# Patient Record
Sex: Female | Born: 1970 | Race: White | Hispanic: No | Smoking: Current some day smoker
Health system: Southern US, Community
[De-identification: ages and names within clinical notes are randomized; demographics above are authoritative.]

## PROBLEM LIST (undated history)

## (undated) DIAGNOSIS — J189 Pneumonia, unspecified organism: Secondary | ICD-10-CM

## (undated) DIAGNOSIS — M797 Fibromyalgia: Secondary | ICD-10-CM

## (undated) DIAGNOSIS — G4730 Sleep apnea, unspecified: Secondary | ICD-10-CM

## (undated) DIAGNOSIS — M1990 Unspecified osteoarthritis, unspecified site: Secondary | ICD-10-CM

## (undated) DIAGNOSIS — F319 Bipolar disorder, unspecified: Secondary | ICD-10-CM

## (undated) DIAGNOSIS — E7800 Pure hypercholesterolemia, unspecified: Secondary | ICD-10-CM

## (undated) DIAGNOSIS — K635 Polyp of colon: Secondary | ICD-10-CM

## (undated) DIAGNOSIS — R911 Solitary pulmonary nodule: Secondary | ICD-10-CM

## (undated) DIAGNOSIS — K589 Irritable bowel syndrome without diarrhea: Secondary | ICD-10-CM

## (undated) DIAGNOSIS — N200 Calculus of kidney: Secondary | ICD-10-CM

## (undated) DIAGNOSIS — E119 Type 2 diabetes mellitus without complications: Secondary | ICD-10-CM

## (undated) DIAGNOSIS — F4310 Post-traumatic stress disorder, unspecified: Secondary | ICD-10-CM

## (undated) DIAGNOSIS — I10 Essential (primary) hypertension: Secondary | ICD-10-CM

## (undated) DIAGNOSIS — K219 Gastro-esophageal reflux disease without esophagitis: Secondary | ICD-10-CM

## (undated) DIAGNOSIS — G43909 Migraine, unspecified, not intractable, without status migrainosus: Secondary | ICD-10-CM

## (undated) DIAGNOSIS — C439 Malignant melanoma of skin, unspecified: Secondary | ICD-10-CM

## (undated) HISTORY — PX: KNEE ARTHROSCOPY: SUR90

## (undated) HISTORY — PX: THROAT SURGERY: SHX803

## (undated) HISTORY — PX: OTHER SURGICAL HISTORY: SHX169

## (undated) HISTORY — PX: DILATION AND CURETTAGE OF UTERUS: SHX78

---

## 2011-09-30 DIAGNOSIS — G4730 Sleep apnea, unspecified: Secondary | ICD-10-CM | POA: Insufficient documentation

## 2011-12-01 DIAGNOSIS — Z872 Personal history of diseases of the skin and subcutaneous tissue: Secondary | ICD-10-CM | POA: Insufficient documentation

## 2011-12-01 DIAGNOSIS — Z85820 Personal history of malignant melanoma of skin: Secondary | ICD-10-CM | POA: Insufficient documentation

## 2011-12-01 DIAGNOSIS — L709 Acne, unspecified: Secondary | ICD-10-CM | POA: Insufficient documentation

## 2012-09-02 DIAGNOSIS — I10 Essential (primary) hypertension: Secondary | ICD-10-CM | POA: Insufficient documentation

## 2012-09-02 DIAGNOSIS — E785 Hyperlipidemia, unspecified: Secondary | ICD-10-CM | POA: Insufficient documentation

## 2012-09-02 DIAGNOSIS — E119 Type 2 diabetes mellitus without complications: Secondary | ICD-10-CM | POA: Insufficient documentation

## 2014-12-20 DIAGNOSIS — G4733 Obstructive sleep apnea (adult) (pediatric): Secondary | ICD-10-CM | POA: Insufficient documentation

## 2014-12-20 DIAGNOSIS — R062 Wheezing: Secondary | ICD-10-CM | POA: Insufficient documentation

## 2014-12-20 DIAGNOSIS — Z720 Tobacco use: Secondary | ICD-10-CM | POA: Insufficient documentation

## 2014-12-20 DIAGNOSIS — R0602 Shortness of breath: Secondary | ICD-10-CM | POA: Insufficient documentation

## 2014-12-20 DIAGNOSIS — Z9989 Dependence on other enabling machines and devices: Secondary | ICD-10-CM | POA: Insufficient documentation

## 2015-06-14 DIAGNOSIS — M25552 Pain in left hip: Secondary | ICD-10-CM | POA: Insufficient documentation

## 2015-12-03 HISTORY — PX: TUBAL LIGATION: SHX77

## 2016-03-18 DIAGNOSIS — Z720 Tobacco use: Secondary | ICD-10-CM | POA: Insufficient documentation

## 2016-03-18 DIAGNOSIS — J189 Pneumonia, unspecified organism: Secondary | ICD-10-CM | POA: Insufficient documentation

## 2016-03-18 DIAGNOSIS — D72825 Bandemia: Secondary | ICD-10-CM | POA: Insufficient documentation

## 2016-03-18 DIAGNOSIS — G9340 Encephalopathy, unspecified: Secondary | ICD-10-CM | POA: Insufficient documentation

## 2016-03-18 DIAGNOSIS — E1165 Type 2 diabetes mellitus with hyperglycemia: Secondary | ICD-10-CM | POA: Insufficient documentation

## 2016-03-18 DIAGNOSIS — E871 Hypo-osmolality and hyponatremia: Secondary | ICD-10-CM | POA: Insufficient documentation

## 2016-03-18 DIAGNOSIS — F1910 Other psychoactive substance abuse, uncomplicated: Secondary | ICD-10-CM | POA: Insufficient documentation

## 2016-03-21 DIAGNOSIS — F1110 Opioid abuse, uncomplicated: Secondary | ICD-10-CM | POA: Insufficient documentation

## 2016-03-21 DIAGNOSIS — F339 Major depressive disorder, recurrent, unspecified: Secondary | ICD-10-CM | POA: Insufficient documentation

## 2016-04-07 DIAGNOSIS — T50902A Poisoning by unspecified drugs, medicaments and biological substances, intentional self-harm, initial encounter: Secondary | ICD-10-CM | POA: Insufficient documentation

## 2016-07-17 ENCOUNTER — Encounter (HOSPITAL_COMMUNITY): Payer: Self-pay | Admitting: Emergency Medicine

## 2016-07-17 ENCOUNTER — Emergency Department (HOSPITAL_COMMUNITY)
Admission: EM | Admit: 2016-07-17 | Discharge: 2016-07-17 | Disposition: A | Discharge status: Home / Self Care | Payer: Medicare Other | Attending: Emergency Medicine | Admitting: Emergency Medicine

## 2016-07-17 ENCOUNTER — Emergency Department (HOSPITAL_COMMUNITY): Payer: Medicare Other

## 2016-07-17 DIAGNOSIS — I10 Essential (primary) hypertension: Secondary | ICD-10-CM | POA: Diagnosis not present

## 2016-07-17 DIAGNOSIS — E119 Type 2 diabetes mellitus without complications: Secondary | ICD-10-CM | POA: Diagnosis not present

## 2016-07-17 DIAGNOSIS — F17200 Nicotine dependence, unspecified, uncomplicated: Secondary | ICD-10-CM | POA: Insufficient documentation

## 2016-07-17 DIAGNOSIS — Z7984 Long term (current) use of oral hypoglycemic drugs: Secondary | ICD-10-CM | POA: Insufficient documentation

## 2016-07-17 DIAGNOSIS — K5900 Constipation, unspecified: Secondary | ICD-10-CM | POA: Insufficient documentation

## 2016-07-17 DIAGNOSIS — Z79899 Other long term (current) drug therapy: Secondary | ICD-10-CM | POA: Diagnosis not present

## 2016-07-17 DIAGNOSIS — Z7982 Long term (current) use of aspirin: Secondary | ICD-10-CM | POA: Insufficient documentation

## 2016-07-17 DIAGNOSIS — K5641 Fecal impaction: Secondary | ICD-10-CM

## 2016-07-17 DIAGNOSIS — R1032 Left lower quadrant pain: Secondary | ICD-10-CM | POA: Diagnosis present

## 2016-07-17 HISTORY — DX: Migraine, unspecified, not intractable, without status migrainosus: G43.909

## 2016-07-17 HISTORY — DX: Irritable bowel syndrome, unspecified: K58.9

## 2016-07-17 HISTORY — DX: Type 2 diabetes mellitus without complications: E11.9

## 2016-07-17 HISTORY — DX: Solitary pulmonary nodule: R91.1

## 2016-07-17 HISTORY — DX: Sleep apnea, unspecified: G47.30

## 2016-07-17 HISTORY — DX: Bipolar disorder, unspecified: F31.9

## 2016-07-17 HISTORY — DX: Post-traumatic stress disorder, unspecified: F43.10

## 2016-07-17 HISTORY — DX: Polyp of colon: K63.5

## 2016-07-17 HISTORY — DX: Essential (primary) hypertension: I10

## 2016-07-17 HISTORY — DX: Pure hypercholesterolemia, unspecified: E78.00

## 2016-07-17 HISTORY — DX: Calculus of kidney: N20.0

## 2016-07-17 HISTORY — DX: Gastro-esophageal reflux disease without esophagitis: K21.9

## 2016-07-17 HISTORY — DX: Malignant melanoma of skin, unspecified: C43.9

## 2016-07-17 HISTORY — DX: Unspecified osteoarthritis, unspecified site: M19.90

## 2016-07-17 LAB — URINALYSIS, ROUTINE W REFLEX MICROSCOPIC
Bilirubin Urine: NEGATIVE
Glucose, UA: NEGATIVE mg/dL
Hgb urine dipstick: NEGATIVE
Ketones, ur: NEGATIVE mg/dL
Leukocytes, UA: NEGATIVE
Nitrite: NEGATIVE
Protein, ur: NEGATIVE mg/dL
Specific Gravity, Urine: 1.005 (ref 1.005–1.030)
pH: 7.5 (ref 5.0–8.0)

## 2016-07-17 LAB — CBC
HCT: 39.1 % (ref 36.0–46.0)
Hemoglobin: 12.1 g/dL (ref 12.0–15.0)
MCH: 24.6 pg — ABNORMAL LOW (ref 26.0–34.0)
MCHC: 30.9 g/dL (ref 30.0–36.0)
MCV: 79.5 fL (ref 78.0–100.0)
Platelets: 332 10*3/uL (ref 150–400)
RBC: 4.92 MIL/uL (ref 3.87–5.11)
RDW: 16.8 % — ABNORMAL HIGH (ref 11.5–15.5)
WBC: 11.8 10*3/uL — ABNORMAL HIGH (ref 4.0–10.5)

## 2016-07-17 LAB — COMPREHENSIVE METABOLIC PANEL
ALT: 18 U/L (ref 14–54)
AST: 21 U/L (ref 15–41)
Albumin: 4.1 g/dL (ref 3.5–5.0)
Alkaline Phosphatase: 58 U/L (ref 38–126)
Anion gap: 7 (ref 5–15)
BUN: 9 mg/dL (ref 6–20)
CO2: 26 mmol/L (ref 22–32)
Calcium: 9 mg/dL (ref 8.9–10.3)
Chloride: 104 mmol/L (ref 101–111)
Creatinine, Ser: 0.57 mg/dL (ref 0.44–1.00)
GFR calc Af Amer: >60 mL/min (ref >60)
GFR calc non Af Amer: >60 mL/min (ref >60)
Glucose, Bld: 111 mg/dL — ABNORMAL HIGH (ref 65–99)
Potassium: 4 mmol/L (ref 3.5–5.1)
Sodium: 137 mmol/L (ref 135–145)
Total Bilirubin: 0.3 mg/dL (ref 0.3–1.2)
Total Protein: 7.1 g/dL (ref 6.5–8.1)

## 2016-07-17 LAB — LIPASE, BLOOD: Lipase: 78 U/L — ABNORMAL HIGH (ref 11–51)

## 2016-07-17 IMAGING — CR DG ABDOMEN ACUTE W/ 1V CHEST
4 series · 4 of 4 positions shown · non-contrast
Comparison: [DATE]

CLINICAL DATA: Left lower quadrant pain that radiates across the
abdomen and back for several days. History of hypertension.

EXAM:
DG ABDOMEN ACUTE W/ 1V CHEST

[w chest pa]
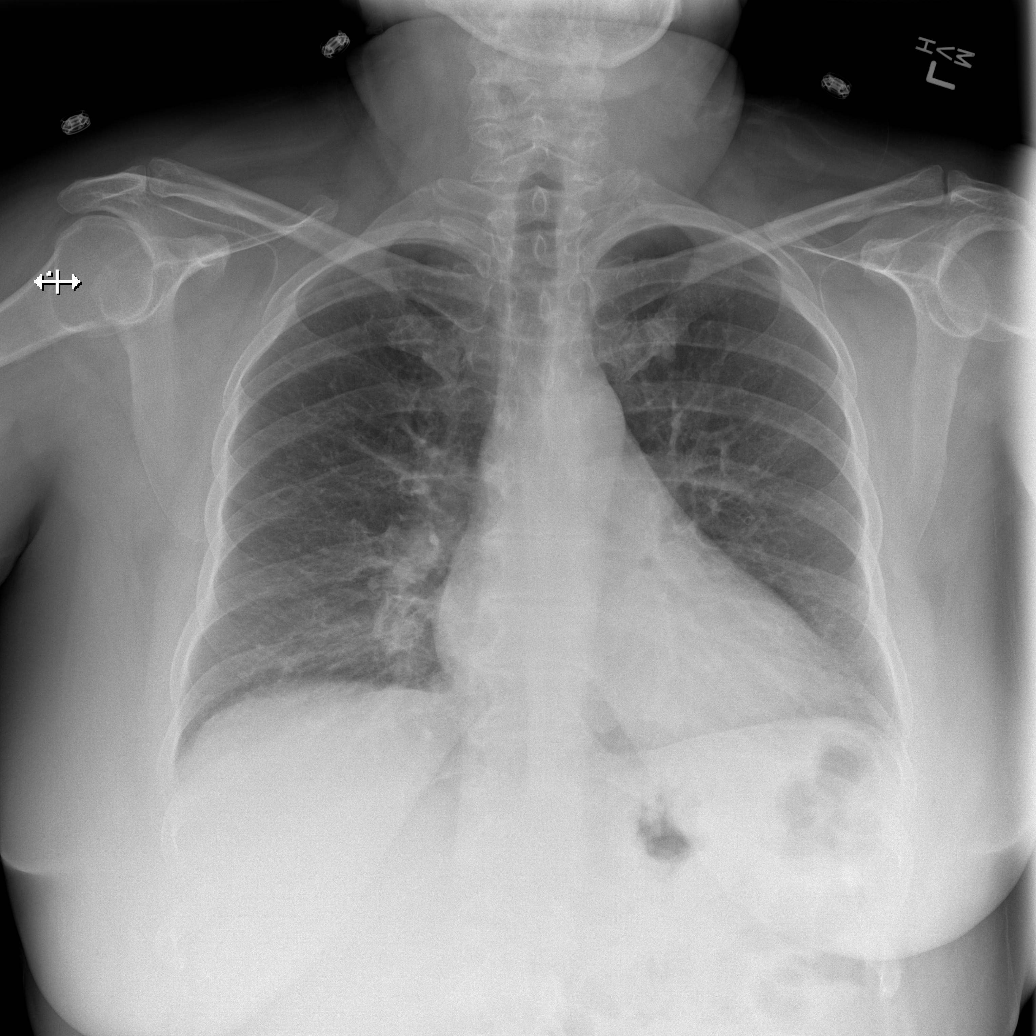

[w abdomen upright]
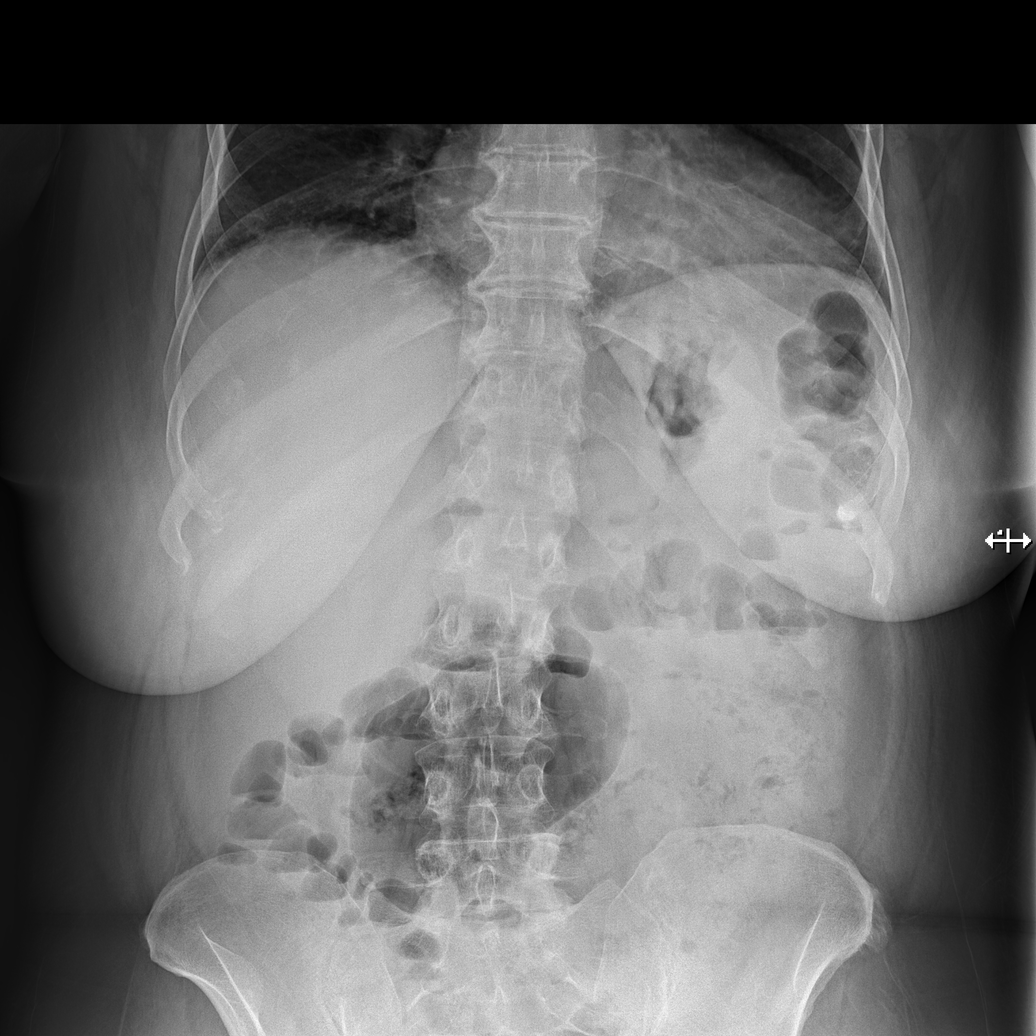

[t abdomen supine (1 of 2)]
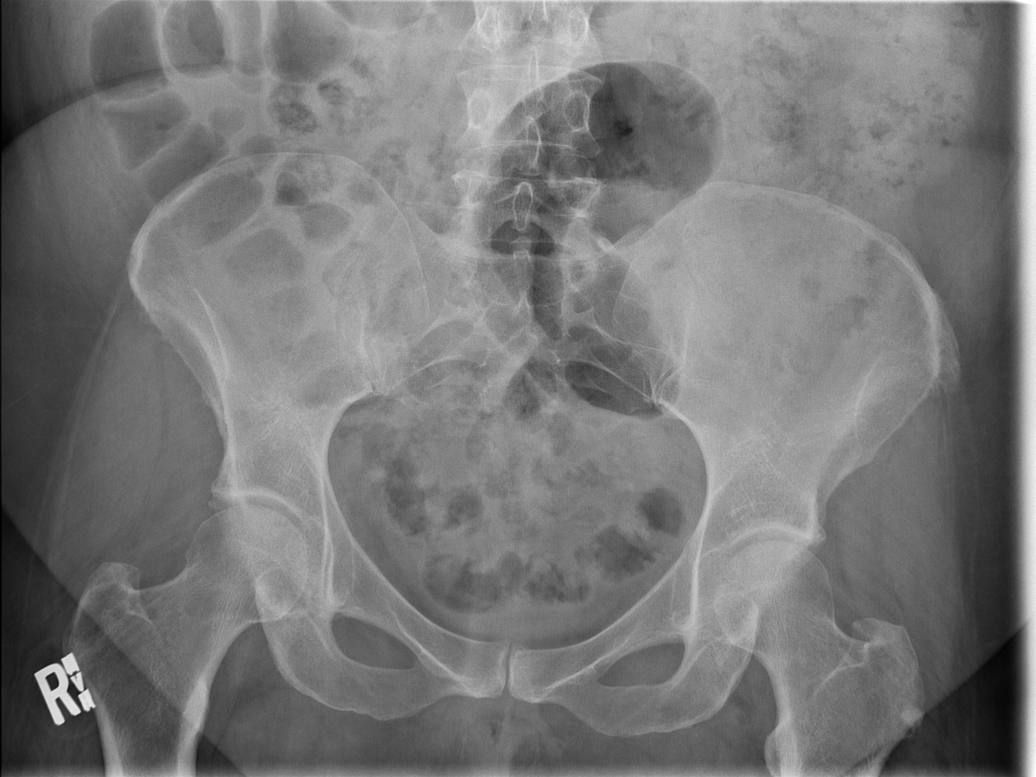

[t abdomen supine (2 of 2)]
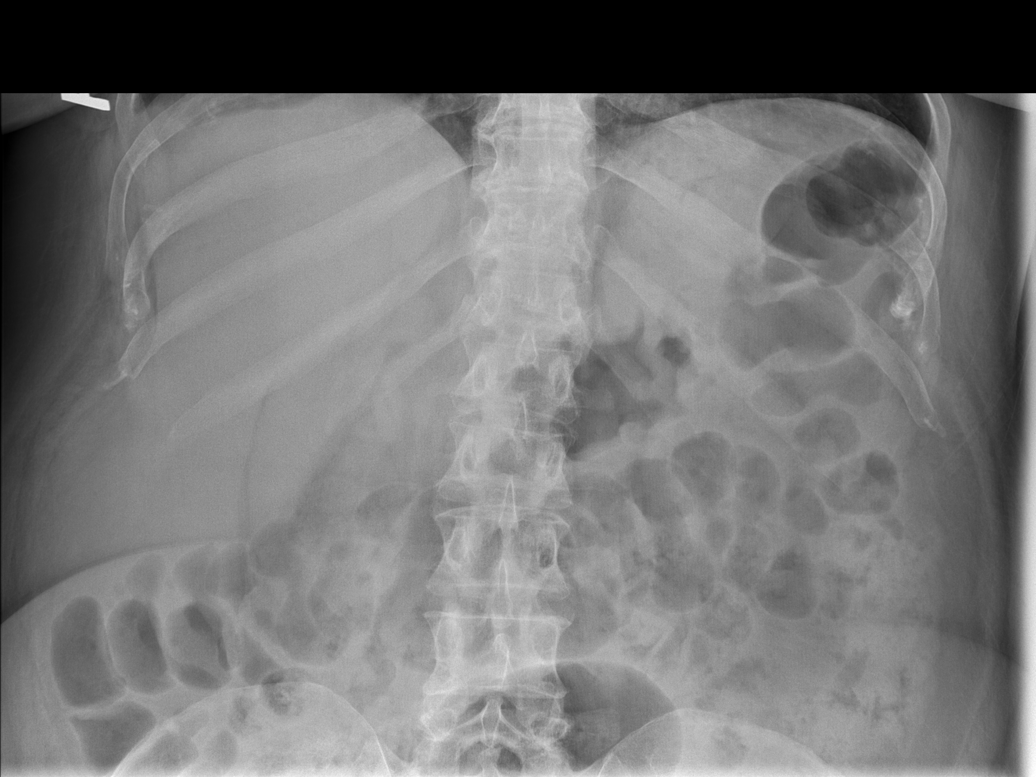

[4 of 4 positions shown; findings below may reference images not displayed]

FINDINGS: PA view chest demonstrates streaky right greater than left bibasilar
opacities, likely atelectasis. No focal consolidation.
Cardiomediastinal silhouette is borderline enlarged. No
pneumothorax.

Supine and upright views of the abdomen demonstrate no free air
beneath the diaphragm. Overall nonspecific gas pattern with mild air
filled colon and prominent loop of sigmoid colon in the pelvis.
Moderate stool is present within the colon. No pathologic
calcifications.
IMPRESSION: 1. Streaky bibasilar right greater than left probable atelectasis.
Borderline enlargement of the heart size.
2. Nonspecific gas pattern with moderate stool in the colon.

## 2016-07-17 MED ORDER — MILK AND MOLASSES ENEMA
1.0000 | Freq: Once | RECTAL | Status: AC
  Administered 2016-07-17: 250 mL via RECTAL
  Filled 2016-07-17: qty 250

## 2016-07-17 MED ORDER — MAGNESIUM CITRATE PO SOLN
1.0000 | Freq: Once | ORAL | Status: AC
  Administered 2016-07-17: 1 via ORAL
  Filled 2016-07-17: qty 296

## 2016-07-17 MED ORDER — LACTULOSE 10 GM/15ML PO SOLN
30.0000 g | Freq: Once | ORAL | Status: AC
  Administered 2016-07-17: 30 g via ORAL
  Filled 2016-07-17: qty 45

## 2016-07-17 NOTE — ED Triage Notes (Signed)
Pt reports LLQ pain that radiates across entire abd and back for the past several days. No n/v/d. Feels constipated, but has had several BMs over past few days. No dark stools.

## 2016-07-17 NOTE — ED Provider Notes (Signed)
[facility] DEPT Provider Note   CSN: [id number] Arrival date & time: 07/17/16  1527     History   Chief Complaint Chief Complaint  Patient presents with  . Abdominal Pain    HPI Stacy Guzman is a 45 y.o. female.  Pt said that she has had LLQ abdominal pain with constipation for the past few days.  The pt said that she tried multiple otc meds and an otc enema without success.  The pt has a lot of abdominal cramping.      Past Medical History:  Diagnosis Date  . Arthritis   . Bipolar 1 disorder ([place])   . Colon polyp   . Diabetes mellitus without complication ([place])   . GERD (gastroesophageal reflux disease)   . Hypercholesteremia   . Hypertension   . IBS (irritable bowel syndrome)   . Kidney stone   . Lung nodule   . Melanoma ([place])   . Migraine   . PTSD (post-traumatic stress disorder)   . Sleep apnea     There are no active problems to display for this patient.   History reviewed. No pertinent surgical history.  OB History    No data available       Home Medications    Prior to Admission medications   Medication Sig Start Date End Date Taking? Authorizing Provider  aspirin EC 81 MG tablet Take 81 mg by mouth daily.   Yes Historical Provider, MD  atenolol (TENORMIN) 50 MG tablet Take 50 mg by mouth daily.   Yes Historical Provider, MD  Cholecalciferol (VITAMIN D3) 5000 units TABS Take 1 tablet by mouth 3 (three) times a week. MWF   Yes Historical Provider, MD  Cinnamon 500 MG capsule Take 500 mg by mouth 2 (two) times daily.   Yes Historical Provider, MD  cyclobenzaprine (FLEXERIL) 10 MG tablet Take 20 mg by mouth 3 (three) times daily as needed for muscle spasms.   Yes Historical Provider, MD  dicyclomine (BENTYL) 20 MG tablet Take 20 mg by mouth 4 (four) times daily.   Yes Historical Provider, MD  DULoxetine (CYMBALTA) 60 MG capsule Take 60 mg by mouth 2 (two) times daily.   Yes Historical Provider, MD  fenofibrate 160 MG tablet Take 160 mg by mouth  daily.   Yes Historical Provider, MD  gabapentin (NEURONTIN) 600 MG tablet Take 1,200 mg by mouth 2 (two) times daily.   Yes Historical Provider, MD  glipiZIDE (GLUCOTROL XL) 5 MG 24 hr tablet Take 5 mg by mouth 2 (two) times daily.   Yes Historical Provider, MD  Magnesium 500 MG TABS Take 1 tablet by mouth 2 (two) times daily.   Yes Historical Provider, MD  metFORMIN (GLUCOPHAGE) 1000 MG tablet Take 1,000 mg by mouth 2 (two) times daily with a meal.   Yes Historical Provider, MD  Omega-3 Fatty Acids (FISH OIL) 1200 MG CAPS Take 1 capsule by mouth daily.   Yes Historical Provider, MD  omeprazole (PRILOSEC) 40 MG capsule Take 40 mg by mouth 2 (two) times daily.   Yes Historical Provider, MD  pioglitazone (ACTOS) 15 MG tablet Take 15 mg by mouth daily.   Yes Historical Provider, MD  pravastatin (PRAVACHOL) 40 MG tablet Take 40 mg by mouth daily.   Yes Historical Provider, MD  SUMAtriptan (IMITREX) 50 MG tablet Take 50 mg by mouth every 2 (two) hours as needed for migraine. May repeat in 2 hours if headache persists or recurs.   Yes Historical Provider, MD  Turmeric 500 MG TABS Take 1 tablet by mouth 2 (two) times daily.   Yes Historical Provider, MD    Family History History reviewed. No pertinent family history.  Social History Social History  Substance Use Topics  . Smoking status: Current Some Day Smoker  . Smokeless tobacco: Never Used  . Alcohol use Yes     Allergies   Ibuprofen; Lamictal [lamotrigine]; Lyrica [pregabalin]; Naproxen; and Nsaids   Review of Systems Review of Systems  Gastrointestinal: Positive for abdominal pain and constipation.  All other systems reviewed and are negative.    Physical Exam Updated Vital Signs BP 128/69   Pulse 62   Temp 97.9 F (36.6 C) (Oral)   Resp 20   SpO2 99%   Physical Exam  Constitutional: She is oriented to person, place, and time. She appears well-developed and well-nourished.  HENT:  Head: Normocephalic and atraumatic.    Right Ear: External ear normal.  Left Ear: External ear normal.  Nose: Nose normal.  Mouth/Throat: Oropharynx is clear and moist.  Eyes: Conjunctivae and EOM are normal. Pupils are equal, round, and reactive to light.  Neck: Normal range of motion. Neck supple.  Cardiovascular: Normal rate, regular rhythm, normal heart sounds and intact distal pulses.   Pulmonary/Chest: Effort normal and breath sounds normal.  Abdominal: Soft. Bowel sounds are decreased.  Musculoskeletal: Normal range of motion.  Neurological: She is alert and oriented to person, place, and time.  Skin: Skin is warm.  Psychiatric: She has a normal mood and affect. Her behavior is normal. Judgment and thought content normal.  Nursing note and vitals reviewed.    ED Treatments / Results  Labs (all labs ordered are listed, but only abnormal results are displayed) Labs Reviewed  LIPASE, BLOOD - Abnormal; Notable for the following:       Result Value   Lipase 78 (*)    All other components within normal limits  COMPREHENSIVE METABOLIC PANEL - Abnormal; Notable for the following:    Glucose, Bld 111 (*)    All other components within normal limits  CBC - Abnormal; Notable for the following:    WBC 11.8 (*)    MCH 24.6 (*)    RDW 16.8 (*)    All other components within normal limits  URINALYSIS, ROUTINE W REFLEX MICROSCOPIC (NOT AT [facility]) - Abnormal; Notable for the following:    APPearance CLOUDY (*)    All other components within normal limits    EKG  EKG Interpretation None       Radiology Dg Abdomen Acute W/chest  Result Date: 07/17/2016 CLINICAL DATA:  Left lower quadrant pain that radiates across the abdomen and back for several days. History of hypertension. EXAM: DG ABDOMEN ACUTE W/ 1V CHEST COMPARISON:  03/18/2016 FINDINGS: PA view chest demonstrates streaky right greater than left bibasilar opacities, likely atelectasis. No focal consolidation. Cardiomediastinal silhouette is borderline enlarged.  No pneumothorax. Supine and upright views of the abdomen demonstrate no free air beneath the diaphragm. Overall nonspecific gas pattern with mild air filled colon and prominent loop of sigmoid colon in the pelvis. Moderate stool is present within the colon. No pathologic calcifications. IMPRESSION: 1. Streaky bibasilar right greater than left probable atelectasis. Borderline enlargement of the heart size. 2. Nonspecific gas pattern with moderate stool in the colon. Electronically Signed   By: Donavan Foil M.D.   On: 07/17/2016 18:59    Procedures Procedures (including critical care time)  Medications Ordered in ED Medications  milk and molasses  enema (not administered)  lactulose (CHRONULAC) 10 GM/15ML solution 30 g (not administered)  magnesium citrate solution 1 Bottle (not administered)     Initial Impression / Assessment and Plan / ED Course  I have reviewed the triage vital signs and the nursing notes.  Pertinent labs & imaging results that were available during my care of the patient were reviewed by me and considered in my medical decision making (see chart for details).  Clinical Course    Pt feels better after enema and disimpaction, but still  She does not have money, so does not want a rx.  The pt will be given a dose of lactulose and mgci prior to d/c.    Final Clinical Impressions(s) / ED Diagnoses   Final diagnoses:  Constipation, unspecified constipation type  Fecal impaction [facility])    New Prescriptions New Prescriptions   No medications on file     Isla Pence, MD 07/17/16 2041

## 2016-08-30 ENCOUNTER — Encounter (HOSPITAL_COMMUNITY): Payer: Self-pay | Admitting: *Deleted

## 2016-08-30 ENCOUNTER — Emergency Department (HOSPITAL_COMMUNITY)
Admission: EM | Admit: 2016-08-30 | Discharge: 2016-08-30 | Disposition: A | Discharge status: Home / Self Care | Payer: Medicare Other | Attending: Emergency Medicine | Admitting: Emergency Medicine

## 2016-08-30 ENCOUNTER — Emergency Department (HOSPITAL_COMMUNITY): Payer: Medicare Other

## 2016-08-30 DIAGNOSIS — I10 Essential (primary) hypertension: Secondary | ICD-10-CM | POA: Insufficient documentation

## 2016-08-30 DIAGNOSIS — Z85820 Personal history of malignant melanoma of skin: Secondary | ICD-10-CM | POA: Diagnosis not present

## 2016-08-30 DIAGNOSIS — E119 Type 2 diabetes mellitus without complications: Secondary | ICD-10-CM | POA: Insufficient documentation

## 2016-08-30 DIAGNOSIS — F17200 Nicotine dependence, unspecified, uncomplicated: Secondary | ICD-10-CM | POA: Insufficient documentation

## 2016-08-30 DIAGNOSIS — Z7982 Long term (current) use of aspirin: Secondary | ICD-10-CM | POA: Insufficient documentation

## 2016-08-30 DIAGNOSIS — Z7984 Long term (current) use of oral hypoglycemic drugs: Secondary | ICD-10-CM | POA: Diagnosis not present

## 2016-08-30 DIAGNOSIS — R0789 Other chest pain: Secondary | ICD-10-CM | POA: Diagnosis not present

## 2016-08-30 LAB — BASIC METABOLIC PANEL
Anion gap: 10 (ref 5–15)
BUN: 9 mg/dL (ref 6–20)
CO2: 23 mmol/L (ref 22–32)
Calcium: 9.3 mg/dL (ref 8.9–10.3)
Chloride: 103 mmol/L (ref 101–111)
Creatinine, Ser: 0.62 mg/dL (ref 0.44–1.00)
GFR calc Af Amer: >60 mL/min (ref >60)
GFR calc non Af Amer: >60 mL/min (ref >60)
Glucose, Bld: 118 mg/dL — ABNORMAL HIGH (ref 65–99)
Potassium: 3.9 mmol/L (ref 3.5–5.1)
Sodium: 136 mmol/L (ref 135–145)

## 2016-08-30 LAB — CBC
HCT: 39.6 % (ref 36.0–46.0)
Hemoglobin: 12.3 g/dL (ref 12.0–15.0)
MCH: 24.3 pg — ABNORMAL LOW (ref 26.0–34.0)
MCHC: 31.1 g/dL (ref 30.0–36.0)
MCV: 78.3 fL (ref 78.0–100.0)
Platelets: 529 10*3/uL — ABNORMAL HIGH (ref 150–400)
RBC: 5.06 MIL/uL (ref 3.87–5.11)
RDW: 18.4 % — ABNORMAL HIGH (ref 11.5–15.5)
WBC: 18.5 10*3/uL — ABNORMAL HIGH (ref 4.0–10.5)

## 2016-08-30 LAB — I-STAT TROPONIN, ED: Troponin i, poc: 0 ng/mL (ref 0.00–0.08)
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
## 2016-08-30 NOTE — Discharge Instructions (Signed)
If you were given medicines take as directed.  If you are on coumadin or contraceptives realize their levels and effectiveness is altered by many different medicines.  If you have any reaction (rash, tongues swelling, other) to the medicines stop taking and see a physician.    If your blood pressure was elevated in the ER make sure you follow up for management with a primary doctor or return for chest pain, shortness of breath or stroke symptoms.  Please follow up as directed and return to the ER or see a physician for new or worsening symptoms.  Thank you. Vitals:   08/30/16 1424 08/30/16 1536  BP: 121/72 123/62  Pulse: 76 73  Resp: 16 18  Temp: 98.1 F (36.7 C)   TempSrc: Oral   SpO2: 100% 100%

## 2016-09-06 ENCOUNTER — Emergency Department (HOSPITAL_COMMUNITY): Payer: Medicare Other

## 2016-09-06 ENCOUNTER — Encounter (HOSPITAL_COMMUNITY): Payer: Self-pay | Admitting: Emergency Medicine

## 2016-09-06 ENCOUNTER — Emergency Department (HOSPITAL_COMMUNITY)
Admission: EM | Admit: 2016-09-06 | Discharge: 2016-09-06 | Disposition: A | Discharge status: Home / Self Care | Payer: Medicare Other | Attending: Emergency Medicine | Admitting: Emergency Medicine

## 2016-09-06 DIAGNOSIS — F17200 Nicotine dependence, unspecified, uncomplicated: Secondary | ICD-10-CM | POA: Diagnosis not present

## 2016-09-06 DIAGNOSIS — S60222A Contusion of left hand, initial encounter: Secondary | ICD-10-CM | POA: Insufficient documentation

## 2016-09-06 DIAGNOSIS — W010XXA Fall on same level from slipping, tripping and stumbling without subsequent striking against object, initial encounter: Secondary | ICD-10-CM | POA: Insufficient documentation

## 2016-09-06 DIAGNOSIS — Y9301 Activity, walking, marching and hiking: Secondary | ICD-10-CM | POA: Diagnosis not present

## 2016-09-06 DIAGNOSIS — Y999 Unspecified external cause status: Secondary | ICD-10-CM | POA: Insufficient documentation

## 2016-09-06 DIAGNOSIS — S4992XA Unspecified injury of left shoulder and upper arm, initial encounter: Secondary | ICD-10-CM | POA: Diagnosis present

## 2016-09-06 DIAGNOSIS — S63502A Unspecified sprain of left wrist, initial encounter: Secondary | ICD-10-CM | POA: Insufficient documentation

## 2016-09-06 DIAGNOSIS — E119 Type 2 diabetes mellitus without complications: Secondary | ICD-10-CM | POA: Diagnosis not present

## 2016-09-06 DIAGNOSIS — W19XXXA Unspecified fall, initial encounter: Secondary | ICD-10-CM

## 2016-09-06 DIAGNOSIS — Y92009 Unspecified place in unspecified non-institutional (private) residence as the place of occurrence of the external cause: Secondary | ICD-10-CM | POA: Diagnosis not present

## 2016-09-06 DIAGNOSIS — Z7982 Long term (current) use of aspirin: Secondary | ICD-10-CM | POA: Insufficient documentation

## 2016-09-06 DIAGNOSIS — Z7984 Long term (current) use of oral hypoglycemic drugs: Secondary | ICD-10-CM | POA: Diagnosis not present

## 2016-09-06 DIAGNOSIS — I10 Essential (primary) hypertension: Secondary | ICD-10-CM | POA: Diagnosis not present

## 2016-09-06 MED ORDER — HYDROCODONE-ACETAMINOPHEN 5-325 MG PO TABS
2.0000 | ORAL_TABLET | Freq: Once | ORAL | Status: AC
  Administered 2016-09-06: 2 via ORAL
  Filled 2016-09-06: qty 2

## 2016-09-06 MED ORDER — HYDROCODONE-ACETAMINOPHEN 5-325 MG PO TABS: 1.0000 | ORAL_TABLET | ORAL | 0 refills | Status: DC | PRN
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
## 2016-09-06 NOTE — ED Provider Notes (Signed)
[facility] DEPT Provider Note   CSN: [id number] Arrival date & time: 09/06/16  [id number]     History   Chief Complaint Chief Complaint  Patient presents with  . Arm Injury    HPI Stacy Guzman is a 45 y.o. female.  HPI 22 old female with history of bipolar disorder here with left arm pain. Patient states she has been sleepwalking over the last several months and has been extensively evaluated for this. She has recurrent falls. Sleepwalking. Last night, she was walking in her house and fell onto her left outstretched hand. She reports she immediately woke up when she fell. She reports feeling a snapping sensation in her wrist and has since had progressively worsening swelling, pain. She shows a pain as an aching, throbbing sensation made worse with any movement or palpation. Denies any alleviating factors. Denies any numbness or tingling. She has no history of injuries. She is right-hand dominant.  Past Medical History:  Diagnosis Date  . Arthritis   . Bipolar 1 disorder ([place])   . Colon polyp   . Diabetes mellitus without complication ([place])   . GERD (gastroesophageal reflux disease)   . Hypercholesteremia   . Hypertension   . IBS (irritable bowel syndrome)   . Kidney stone   . Lung nodule   . Melanoma ([place])   . Migraine   . PTSD (post-traumatic stress disorder)   . Sleep apnea     There are no active problems to display for this patient.   History reviewed. No pertinent surgical history.  OB History    No data available       Home Medications    Prior to Admission medications   Medication Sig Start Date End Date Taking? Authorizing Provider  aspirin EC 81 MG tablet Take 81 mg by mouth daily.    Historical Provider, MD  atenolol (TENORMIN) 50 MG tablet Take 50 mg by mouth daily.    Historical Provider, MD  Cholecalciferol (VITAMIN D3) 5000 units TABS Take 1 tablet by mouth 3 (three) times a week. MWF    Historical Provider, MD  Cinnamon 500 MG capsule Take 500 mg  by mouth 2 (two) times daily.    Historical Provider, MD  cyclobenzaprine (FLEXERIL) 10 MG tablet Take 20 mg by mouth 3 (three) times daily as needed for muscle spasms.    Historical Provider, MD  dicyclomine (BENTYL) 20 MG tablet Take 20 mg by mouth 4 (four) times daily.    Historical Provider, MD  DULoxetine (CYMBALTA) 60 MG capsule Take 60 mg by mouth 2 (two) times daily.    Historical Provider, MD  fenofibrate 160 MG tablet Take 160 mg by mouth daily.    Historical Provider, MD  gabapentin (NEURONTIN) 600 MG tablet Take 1,200 mg by mouth 2 (two) times daily.    Historical Provider, MD  glipiZIDE (GLUCOTROL XL) 5 MG 24 hr tablet Take 5 mg by mouth 2 (two) times daily.    Historical Provider, MD  HYDROcodone-acetaminophen (NORCO/VICODIN) 5-325 MG tablet Take 1-2 tablets by mouth every 4 (four) hours as needed for moderate pain or severe pain. 09/06/16   Duffy Bruce, MD  Magnesium 500 MG TABS Take 1 tablet by mouth 2 (two) times daily.    Historical Provider, MD  metFORMIN (GLUCOPHAGE) 1000 MG tablet Take 1,000 mg by mouth 2 (two) times daily with a meal.    Historical Provider, MD  Omega-3 Fatty Acids (FISH OIL) 1200 MG CAPS Take 1 capsule by mouth daily.  Historical Provider, MD  omeprazole (PRILOSEC) 40 MG capsule Take 40 mg by mouth 2 (two) times daily.    Historical Provider, MD  pioglitazone (ACTOS) 15 MG tablet Take 15 mg by mouth daily.    Historical Provider, MD  pravastatin (PRAVACHOL) 40 MG tablet Take 40 mg by mouth daily.    Historical Provider, MD  SUMAtriptan (IMITREX) 50 MG tablet Take 50 mg by mouth every 2 (two) hours as needed for migraine. May repeat in 2 hours if headache persists or recurs.    Historical Provider, MD  Turmeric 500 MG TABS Take 1 tablet by mouth 2 (two) times daily.    Historical Provider, MD    Family History History reviewed. No pertinent family history.  Social History Social History  Substance Use Topics  . Smoking status: Current Some Day  Smoker  . Smokeless tobacco: Never Used  . Alcohol use Yes     Allergies   Ibuprofen; Lamictal [lamotrigine]; Lyrica [pregabalin]; Naproxen; and Nsaids   Review of Systems Review of Systems  Constitutional: Negative for chills and fever.  Respiratory: Negative for shortness of breath.   Cardiovascular: Negative for chest pain.  Musculoskeletal: Positive for arthralgias and joint swelling. Negative for neck pain.  Skin: Negative for rash and wound.  Allergic/Immunologic: Negative for immunocompromised state.  Neurological: Negative for weakness and numbness.  Hematological: Does not bruise/bleed easily.     Physical Exam Updated Vital Signs BP 119/80 (BP Location: Right Arm)   Pulse 60   Temp 97.6 F (36.4 C) (Oral)   Resp 18   Wt 209 lb (94.8 kg)   SpO2 97%   Physical Exam  Constitutional: She is oriented to person, place, and time. She appears well-developed and well-nourished. No distress.  HENT:  Head: Normocephalic and atraumatic.  Eyes: Conjunctivae are normal.  Neck: Neck supple.  Cardiovascular: Normal rate, regular rhythm and normal heart sounds.   Pulmonary/Chest: Effort normal. No respiratory distress. She has no wheezes.  Abdominal: She exhibits no distension.  Musculoskeletal: She exhibits no edema.  Neurological: She is alert and oriented to person, place, and time. She exhibits normal muscle tone.  Skin: Skin is warm. Capillary refill takes less than 2 seconds. No rash noted.  Nursing note and vitals reviewed.   UPPER EXTREMITY EXAM: RIGHT  INSPECTION & PALPATION: Moderate TTP to right wrist dorsal aspect, as well as proximal hand including anatomic snuffbox. No obvious deformity. Mild swelling. No open wounds.  SENSORY: Sensation is intact to light touch in:  Superficial radial nerve distribution (dorsal first web space) Median nerve distribution (tip of index finger)   Ulnar nerve distribution (tip of small finger)     MOTOR:  + Motor  posterior interosseous nerve (thumb IP extension) + Anterior interosseous nerve (thumb IP flexion, index finger DIP flexion) + Radial nerve (wrist extension) + Median nerve (palpable firing thenar mass) + Ulnar nerve (palpable firing of first dorsal interosseous muscle)  VASCULAR: 2+ radial pulse Brisk capillary refill < 2 sec, fingers warm and well-perfused   ED Treatments / Results  Labs (all labs ordered are listed, but only abnormal results are displayed) Labs Reviewed - No data to display  EKG  EKG Interpretation None       Radiology Dg Forearm Left  Result Date: 09/06/2016 CLINICAL DATA:  Recent fall with left forearm pain, initial encounter EXAM: LEFT FOREARM - 2 VIEW COMPARISON:  None. FINDINGS: There is no evidence of fracture or other focal bone lesions. Soft tissues are unremarkable.  IMPRESSION: No acute abnormality noted. Electronically Signed   By: Inez Catalina M.D.   On: 09/06/2016 10:30   Dg Hand Complete Left  Result Date: 09/06/2016 CLINICAL DATA:  Fall yesterday with hand pain, initial encounter EXAM: LEFT HAND - COMPLETE 3+ VIEW COMPARISON:  None. FINDINGS: There is no evidence of fracture or dislocation. There is no evidence of arthropathy or other focal bone abnormality. Soft tissues are unremarkable. IMPRESSION: No acute abnormality noted. Electronically Signed   By: Inez Catalina M.D.   On: 09/06/2016 10:30    Procedures Procedures (including critical care time)  Medications Ordered in ED Medications  HYDROcodone-acetaminophen (NORCO/VICODIN) 5-325 MG per tablet 2 tablet (2 tablets Oral Given 09/06/16 1020)     Initial Impression / Assessment and Plan / ED Course  I have reviewed the triage vital signs and the nursing notes.  Pertinent labs & imaging results that were available during my care of the patient were reviewed by me and considered in my medical decision making (see chart for details).  Clinical Course     45 year old right-hand  dominant female here with left wrist and hand pain after fall. Patient has history of recurrent falls due to reported "sleep attacks" which she is followed extensively for. No new or concerning symptoms from syncope standpoint. Plain films show no acute fracture. She is neurovascularly intact. Given her tenderness in the anatomic snuffbox, will place in thumb spica splint and refer for outpatient repeat x-rays. Pain control given. Return precautions given.  Final Clinical Impressions(s) / ED Diagnoses   Final diagnoses:  Fall, initial encounter  Contusion of left hand, initial encounter  Sprain of left wrist, initial encounter    New Prescriptions New Prescriptions   HYDROCODONE-ACETAMINOPHEN (NORCO/VICODIN) 5-325 MG TABLET    Take 1-2 tablets by mouth every 4 (four) hours as needed for moderate pain or severe pain.     Duffy Bruce, MD 09/06/16 [phone]

## 2016-09-29 ENCOUNTER — Encounter (HOSPITAL_COMMUNITY): Payer: Self-pay | Admitting: Emergency Medicine

## 2016-09-29 ENCOUNTER — Emergency Department (HOSPITAL_COMMUNITY)
Admission: EM | Admit: 2016-09-29 | Discharge: 2016-09-29 | Disposition: A | Discharge status: Home / Self Care | Payer: Medicare Other | Attending: Emergency Medicine | Admitting: Emergency Medicine

## 2016-09-29 DIAGNOSIS — F17200 Nicotine dependence, unspecified, uncomplicated: Secondary | ICD-10-CM | POA: Insufficient documentation

## 2016-09-29 DIAGNOSIS — Z7982 Long term (current) use of aspirin: Secondary | ICD-10-CM | POA: Insufficient documentation

## 2016-09-29 DIAGNOSIS — I10 Essential (primary) hypertension: Secondary | ICD-10-CM | POA: Diagnosis not present

## 2016-09-29 DIAGNOSIS — E119 Type 2 diabetes mellitus without complications: Secondary | ICD-10-CM | POA: Insufficient documentation

## 2016-09-29 DIAGNOSIS — G43909 Migraine, unspecified, not intractable, without status migrainosus: Secondary | ICD-10-CM

## 2016-09-29 DIAGNOSIS — R51 Headache: Secondary | ICD-10-CM | POA: Diagnosis present

## 2016-09-29 DIAGNOSIS — L0100 Impetigo, unspecified: Secondary | ICD-10-CM | POA: Diagnosis not present

## 2016-09-29 DIAGNOSIS — Z7984 Long term (current) use of oral hypoglycemic drugs: Secondary | ICD-10-CM | POA: Insufficient documentation

## 2016-09-29 MED ORDER — DIPHENHYDRAMINE HCL 25 MG PO CAPS
25.0000 mg | ORAL_CAPSULE | Freq: Once | ORAL | Status: AC
  Administered 2016-09-29: 25 mg via ORAL
  Filled 2016-09-29: qty 1

## 2016-09-29 MED ORDER — METOCLOPRAMIDE HCL 10 MG PO TABS
10.0000 mg | ORAL_TABLET | Freq: Once | ORAL | Status: AC
  Administered 2016-09-29: 10 mg via ORAL
  Filled 2016-09-29: qty 1

## 2016-09-29 MED ORDER — CEPHALEXIN 250 MG PO CAPS: 250.0000 mg | ORAL_CAPSULE | Freq: Four times a day (QID) | ORAL | 0 refills | Status: DC

## 2016-09-29 MED ORDER — KETOROLAC TROMETHAMINE 30 MG/ML IJ SOLN
30.0000 mg | Freq: Once | INTRAMUSCULAR | Status: AC
  Administered 2016-09-29: 30 mg via INTRAMUSCULAR
  Filled 2016-09-29: qty 1

## 2016-09-29 NOTE — Progress Notes (Signed)
CSW informed ED CM of needed consult.   Kingsley Spittle, LCSWA Clinical Social Worker [phone]

## 2016-09-29 NOTE — Discharge Instructions (Signed)
Please read attached information. If you experience any new or worsening signs or symptoms please return to the emergency room for evaluation. Please follow-up with your primary care provider or specialist as discussed. Please use medication prescribed only as directed and discontinue taking if you have any concerning signs or symptoms.   °

## 2016-09-29 NOTE — ED Triage Notes (Signed)
Patient c/o right sided headache and right ear pain for several days.  Patient has right arm tingling x week that has been intermittent. Patient reports PCP told her that she may need to see neurologist.  Patient states that she is in transition of finding new PCP in [place] and new insurance will start in Jan.  Patient has rash on face and head that has been going on for past several days.  Patient states that she is going through a really bad divorce and has a lot of stress in her life right now.  Patient has bilat splints on bilat wrist from sleep walking injuries.

## 2016-09-29 NOTE — ED Provider Notes (Signed)
[facility] DEPT Provider Note   CSN: [id number] Arrival date & time: 09/29/16  1518   History   Chief Complaint Chief Complaint  Patient presents with  . Headache  . Otalgia  . Rash  . arm numbness    HPI [name] is a 45 y.o. female.  HPI   45 year old female presents today with numerous complaints. Patient reports she recently moved to the area, has been unable to establish care with primary care provider. She notes that she developed a rash to her face involving the jaw and left cheek. She reports symptoms including itching and crusting. She reports this is due to recent stress. Denies any history of same, denies any intraoral involvement. She also notes that several days ago she was walking on the cold and developed an earache on the right side. She reports symptoms are made worse with warm compression to the ear, she denies any swelling to the year, no difficulty hearing. Patient denies any associated upper respiratory complaints, sore throat, dental pain.  Patient reports the earache is associated with a right sided migraine. She notes a history of the same, but has been able to afford her migraine medicine.Pt denies aura, reports light sensitivity.  She denies any focal neurological deficits, fever, head trauma, or any other red flags for headaches. Patient has not taken any medications prior to arrival  Additionally patient notes that she has not had sexual intercourse in the last year, has recently started having sex with a new female partner. She reports that she had very minor bleeding with intercourse, she feels this is due to vaginal dryness, consult her OB/GYN who instructed to use lubrication as she is menopausal with likely vaginal dryness. She is instructed the patient to come to the clinic for evaluation, but she has not followed up. She denies any vaginal bleeding or discharge at baseline. She denies any significant painful intercourse.   Past Medical History:    Diagnosis Date  . Arthritis   . Bipolar 1 disorder ([place])   . Colon polyp   . Diabetes mellitus without complication ([place])   . GERD (gastroesophageal reflux disease)   . Hypercholesteremia   . Hypertension   . IBS (irritable bowel syndrome)   . Kidney stone   . Lung nodule   . Melanoma ([place])   . Migraine   . PTSD (post-traumatic stress disorder)   . Sleep apnea     There are no active problems to display for this patient.   History reviewed. No pertinent surgical history.  OB History    No data available     Home Medications    Prior to Admission medications   Medication Sig Start Date End Date Taking? Authorizing Provider  aspirin EC 81 MG tablet Take 81 mg by mouth daily.   Yes Historical Provider, MD  atenolol (TENORMIN) 50 MG tablet Take 50 mg by mouth daily.   Yes Historical Provider, MD  Cholecalciferol (VITAMIN D3) 5000 units TABS Take 1 tablet by mouth 3 (three) times a week. MWF   Yes Historical Provider, MD  Cinnamon 500 MG capsule Take 500 mg by mouth 2 (two) times daily.   Yes Historical Provider, MD  dicyclomine (BENTYL) 20 MG tablet Take 20 mg by mouth 4 (four) times daily.   Yes Historical Provider, MD  DULoxetine (CYMBALTA) 60 MG capsule Take 60 mg by mouth 2 (two) times daily.   Yes Historical Provider, MD  fenofibrate 160 MG tablet Take 160 mg by mouth  daily.   Yes Historical Provider, MD  gabapentin (NEURONTIN) 600 MG tablet Take 1,200 mg by mouth 2 (two) times daily.   Yes Historical Provider, MD  glipiZIDE (GLUCOTROL XL) 5 MG 24 hr tablet Take 5 mg by mouth 2 (two) times daily.   Yes Historical Provider, MD  Magnesium 500 MG TABS Take 1 tablet by mouth 2 (two) times daily.   Yes Historical Provider, MD  Melatonin 3 MG TABS Take 4 tablets by mouth at bedtime.   Yes Historical Provider, MD  metFORMIN (GLUCOPHAGE) 1000 MG tablet Take 1,000 mg by mouth 2 (two) times daily with a meal.   Yes Historical Provider, MD  Omega-3 Fatty Acids (FISH OIL) 1200 MG  CAPS Take 1 capsule by mouth daily.   Yes Historical Provider, MD  omeprazole (PRILOSEC) 40 MG capsule Take 40 mg by mouth 2 (two) times daily.   Yes Historical Provider, MD  pioglitazone (ACTOS) 15 MG tablet Take 15 mg by mouth daily.   Yes Historical Provider, MD  pravastatin (PRAVACHOL) 40 MG tablet Take 40 mg by mouth daily.   Yes Historical Provider, MD  Turmeric 500 MG TABS Take 1 tablet by mouth 2 (two) times daily.   Yes Historical Provider, MD  cephALEXin (KEFLEX) 250 MG capsule Take 1 capsule (250 mg total) by mouth 4 (four) times daily. 09/29/16   Okey Regal, PA-C  HYDROcodone-acetaminophen (NORCO/VICODIN) 5-325 MG tablet Take 1-2 tablets by mouth every 4 (four) hours as needed for moderate pain or severe pain. Patient not taking: Reported on 09/29/2016 09/06/16   Duffy Bruce, MD    Family History No family history on file.  Social History Social History  Substance Use Topics  . Smoking status: Current Some Day Smoker  . Smokeless tobacco: Never Used  . Alcohol use Yes     Allergies   Ibuprofen; Lamictal [lamotrigine]; Lyrica [pregabalin]; Naproxen; and Nsaids   Review of Systems Review of Systems  All other systems reviewed and are negative.  Physical Exam Updated Vital Signs BP 145/75 (BP Location: Left Arm)   Pulse 71   Temp 98.4 F (36.9 C) (Oral)   Resp 18   SpO2 97%   Physical Exam  Constitutional: She is oriented to person, place, and time. She appears well-developed and well-nourished.  HENT:  Head: Normocephalic and atraumatic.  Right Ear: Hearing, tympanic membrane and ear canal normal.  Left Ear: Hearing, tympanic membrane and ear canal normal.  Mouth/Throat: Uvula is midline, oropharynx is clear and moist and mucous membranes are normal. No oropharyngeal exudate, posterior oropharyngeal edema, posterior oropharyngeal erythema or tonsillar abscesses.  Impetigo to jaw and left cheek  Eyes: Conjunctivae are normal. Pupils are equal, round, and  reactive to light. Right eye exhibits no discharge. Left eye exhibits no discharge. No scleral icterus.  Neck: Normal range of motion. No JVD present. No tracheal deviation present.  Pulmonary/Chest: Effort normal. No stridor.  Neurological: She is alert and oriented to person, place, and time. She has normal strength. No cranial nerve deficit or sensory deficit. Coordination normal. GCS eye subscore is 4. GCS verbal subscore is 5. GCS motor subscore is 6.  Psychiatric: She has a normal mood and affect. Her behavior is normal. Judgment and thought content normal.  Nursing note and vitals reviewed.    ED Treatments / Results  Labs (all labs ordered are listed, but only abnormal results are displayed) Labs Reviewed - No data to display  EKG  EKG Interpretation None  Radiology No results found.  Procedures Procedures (including critical care time)  Medications Ordered in ED Medications  ketorolac (TORADOL) 30 MG/ML injection 30 mg (not administered)  metoCLOPramide (REGLAN) tablet 10 mg (not administered)  diphenhydrAMINE (BENADRYL) capsule 25 mg (not administered)     Initial Impression / Assessment and Plan / ED Course  I have reviewed the triage vital signs and the nursing notes.  Pertinent labs & imaging results that were available during my care of the patient were reviewed by me and considered in my medical decision making (see chart for details).  Clinical Course     Final Clinical Impressions(s) / ED Diagnoses   Final diagnoses:  Migraine without status migrainosus, not intractable, unspecified migraine type  Impetigo    Labs:   Imaging:  Consults:  Therapeutics: Toradol, Reglan, Benadryl  Discharge Meds: Keflex  Assessment/Plan:  For social female presents today with numerous complaints. Patient denies any earache, no signs of infectious etiology, no dental infection or intraoral infection. Patient also notes a rash to her face which is  consistent with impetigo. She'll be started on Keflex. Patient also here with migraine, she has no red flags, typical of previous. She will be given a migraine cocktail with likely disposition home with symptomatic improvement.    New Prescriptions New Prescriptions   CEPHALEXIN (KEFLEX) 250 MG CAPSULE    Take 1 capsule (250 mg total) by mouth 4 (four) times daily.     Okey Regal, PA-C 09/29/16 2109    Dorie Rank, MD 09/30/16 [phone]

## 2016-09-29 NOTE — ED Notes (Addendum)
Pt requested that this writer make a note of a "female problem" in which she bleeds after having intercourse that she would like to be checked out for

## 2016-11-18 DIAGNOSIS — F319 Bipolar disorder, unspecified: Secondary | ICD-10-CM | POA: Diagnosis not present

## 2016-11-18 DIAGNOSIS — J45909 Unspecified asthma, uncomplicated: Secondary | ICD-10-CM | POA: Diagnosis not present

## 2016-11-18 DIAGNOSIS — G43909 Migraine, unspecified, not intractable, without status migrainosus: Secondary | ICD-10-CM | POA: Diagnosis not present

## 2016-11-18 DIAGNOSIS — I10 Essential (primary) hypertension: Secondary | ICD-10-CM | POA: Diagnosis not present

## 2016-11-18 DIAGNOSIS — E1165 Type 2 diabetes mellitus with hyperglycemia: Secondary | ICD-10-CM | POA: Diagnosis not present

## 2016-11-18 DIAGNOSIS — G4733 Obstructive sleep apnea (adult) (pediatric): Secondary | ICD-10-CM | POA: Diagnosis not present

## 2016-11-18 DIAGNOSIS — K589 Irritable bowel syndrome without diarrhea: Secondary | ICD-10-CM | POA: Diagnosis not present

## 2016-11-18 DIAGNOSIS — K219 Gastro-esophageal reflux disease without esophagitis: Secondary | ICD-10-CM | POA: Diagnosis not present

## 2016-11-18 DIAGNOSIS — C439 Malignant melanoma of skin, unspecified: Secondary | ICD-10-CM | POA: Diagnosis not present

## 2016-11-18 DIAGNOSIS — M797 Fibromyalgia: Secondary | ICD-10-CM | POA: Diagnosis not present

## 2016-11-18 DIAGNOSIS — T50902D Poisoning by unspecified drugs, medicaments and biological substances, intentional self-harm, subsequent encounter: Secondary | ICD-10-CM | POA: Diagnosis not present

## 2016-11-18 DIAGNOSIS — E7800 Pure hypercholesterolemia, unspecified: Secondary | ICD-10-CM | POA: Diagnosis not present

## 2016-12-15 DIAGNOSIS — I10 Essential (primary) hypertension: Secondary | ICD-10-CM | POA: Diagnosis not present

## 2016-12-15 DIAGNOSIS — M797 Fibromyalgia: Secondary | ICD-10-CM | POA: Diagnosis not present

## 2016-12-15 DIAGNOSIS — K589 Irritable bowel syndrome without diarrhea: Secondary | ICD-10-CM | POA: Diagnosis not present

## 2016-12-15 DIAGNOSIS — K219 Gastro-esophageal reflux disease without esophagitis: Secondary | ICD-10-CM | POA: Diagnosis not present

## 2016-12-15 DIAGNOSIS — F319 Bipolar disorder, unspecified: Secondary | ICD-10-CM | POA: Diagnosis not present

## 2016-12-15 DIAGNOSIS — N898 Other specified noninflammatory disorders of vagina: Secondary | ICD-10-CM | POA: Diagnosis not present

## 2016-12-15 DIAGNOSIS — E7800 Pure hypercholesterolemia, unspecified: Secondary | ICD-10-CM | POA: Diagnosis not present

## 2016-12-15 DIAGNOSIS — B958 Unspecified staphylococcus as the cause of diseases classified elsewhere: Secondary | ICD-10-CM | POA: Diagnosis not present

## 2016-12-15 DIAGNOSIS — J069 Acute upper respiratory infection, unspecified: Secondary | ICD-10-CM | POA: Diagnosis not present

## 2016-12-15 DIAGNOSIS — E1149 Type 2 diabetes mellitus with other diabetic neurological complication: Secondary | ICD-10-CM | POA: Diagnosis not present

## 2016-12-28 ENCOUNTER — Emergency Department (HOSPITAL_COMMUNITY): Payer: PPO

## 2016-12-28 ENCOUNTER — Emergency Department (HOSPITAL_COMMUNITY)
Admission: EM | Admit: 2016-12-28 | Discharge: 2016-12-28 | Disposition: A | Discharge status: Home / Self Care | Payer: PPO | Attending: Emergency Medicine | Admitting: Emergency Medicine

## 2016-12-28 ENCOUNTER — Encounter (HOSPITAL_COMMUNITY): Payer: Self-pay | Admitting: Emergency Medicine

## 2016-12-28 DIAGNOSIS — E119 Type 2 diabetes mellitus without complications: Secondary | ICD-10-CM | POA: Insufficient documentation

## 2016-12-28 DIAGNOSIS — I10 Essential (primary) hypertension: Secondary | ICD-10-CM | POA: Diagnosis not present

## 2016-12-28 DIAGNOSIS — Z7982 Long term (current) use of aspirin: Secondary | ICD-10-CM | POA: Insufficient documentation

## 2016-12-28 DIAGNOSIS — R109 Unspecified abdominal pain: Secondary | ICD-10-CM

## 2016-12-28 DIAGNOSIS — F17200 Nicotine dependence, unspecified, uncomplicated: Secondary | ICD-10-CM | POA: Insufficient documentation

## 2016-12-28 DIAGNOSIS — N200 Calculus of kidney: Secondary | ICD-10-CM | POA: Diagnosis not present

## 2016-12-28 DIAGNOSIS — Z7984 Long term (current) use of oral hypoglycemic drugs: Secondary | ICD-10-CM | POA: Insufficient documentation

## 2016-12-28 DIAGNOSIS — R1084 Generalized abdominal pain: Secondary | ICD-10-CM | POA: Insufficient documentation

## 2016-12-28 DIAGNOSIS — R52 Pain, unspecified: Secondary | ICD-10-CM

## 2016-12-28 LAB — CBC WITH DIFFERENTIAL/PLATELET
Basophils Absolute: 0 10*3/uL (ref 0.0–0.1)
Basophils Relative: 0 %
Eosinophils Absolute: 0.1 10*3/uL (ref 0.0–0.7)
Eosinophils Relative: 1 %
HCT: 32.9 % — ABNORMAL LOW (ref 36.0–46.0)
Hemoglobin: 10.3 g/dL — ABNORMAL LOW (ref 12.0–15.0)
Lymphocytes Relative: 32 %
Lymphs Abs: 3 10*3/uL (ref 0.7–4.0)
MCH: 22.6 pg — ABNORMAL LOW (ref 26.0–34.0)
MCHC: 31.3 g/dL (ref 30.0–36.0)
MCV: 72.3 fL — ABNORMAL LOW (ref 78.0–100.0)
Monocytes Absolute: 0.8 10*3/uL (ref 0.1–1.0)
Monocytes Relative: 9 %
Neutro Abs: 5.5 10*3/uL (ref 1.7–7.7)
Neutrophils Relative %: 58 %
Platelets: 316 10*3/uL (ref 150–400)
RBC: 4.55 MIL/uL (ref 3.87–5.11)
RDW: 17.3 % — ABNORMAL HIGH (ref 11.5–15.5)
WBC: 9.5 10*3/uL (ref 4.0–10.5)

## 2016-12-28 LAB — URINALYSIS, ROUTINE W REFLEX MICROSCOPIC
Bilirubin Urine: NEGATIVE
Glucose, UA: NEGATIVE mg/dL
Hgb urine dipstick: NEGATIVE
Ketones, ur: NEGATIVE mg/dL
Nitrite: NEGATIVE
Protein, ur: NEGATIVE mg/dL
Specific Gravity, Urine: 1.009 (ref 1.005–1.030)
pH: 8 (ref 5.0–8.0)

## 2016-12-28 LAB — COMPREHENSIVE METABOLIC PANEL
ALT: 15 U/L (ref 14–54)
AST: 18 U/L (ref 15–41)
Albumin: 3.5 g/dL (ref 3.5–5.0)
Alkaline Phosphatase: 51 U/L (ref 38–126)
Anion gap: 7 (ref 5–15)
BUN: 11 mg/dL (ref 6–20)
CO2: 21 mmol/L — ABNORMAL LOW (ref 22–32)
Calcium: 8.7 mg/dL — ABNORMAL LOW (ref 8.9–10.3)
Chloride: 111 mmol/L (ref 101–111)
Creatinine, Ser: 0.48 mg/dL (ref 0.44–1.00)
GFR calc Af Amer: >60 mL/min (ref >60)
GFR calc non Af Amer: >60 mL/min (ref >60)
Glucose, Bld: 125 mg/dL — ABNORMAL HIGH (ref 65–99)
Potassium: 3.4 mmol/L — ABNORMAL LOW (ref 3.5–5.1)
Sodium: 139 mmol/L (ref 135–145)
Total Bilirubin: 0.6 mg/dL (ref 0.3–1.2)
Total Protein: 6.6 g/dL (ref 6.5–8.1)

## 2016-12-28 LAB — LIPASE, BLOOD: Lipase: 43 U/L (ref 11–51)

## 2016-12-28 MED ORDER — HYDROMORPHONE HCL 1 MG/ML IJ SOLN
1.0000 mg | Freq: Once | INTRAMUSCULAR | Status: AC
  Administered 2016-12-28: 1 mg via INTRAVENOUS
  Filled 2016-12-28: qty 1

## 2016-12-28 MED ORDER — ONDANSETRON HCL 4 MG/2ML IJ SOLN
4.0000 mg | Freq: Once | INTRAMUSCULAR | Status: AC
  Administered 2016-12-28: 4 mg via INTRAVENOUS
  Filled 2016-12-28: qty 2

## 2016-12-28 MED ORDER — MORPHINE SULFATE (PF) 4 MG/ML IV SOLN
4.0000 mg | Freq: Once | INTRAVENOUS | Status: AC
Start: 2016-12-28 — End: 2016-12-28
  Administered 2016-12-28: 4 mg via INTRAVENOUS
  Filled 2016-12-28: qty 1

## 2016-12-28 MED ORDER — SODIUM CHLORIDE 0.9 % IV BOLUS (SEPSIS)
1000.0000 mL | Freq: Once | INTRAVENOUS | Status: AC
  Administered 2016-12-28: 1000 mL via INTRAVENOUS

## 2016-12-28 MED ORDER — BARIUM SULFATE 2 % PO SUSP
450.0000 mL | Freq: Once | ORAL | Status: AC
  Administered 2016-12-28: 450 mL via ORAL

## 2016-12-28 MED ORDER — ONDANSETRON HCL 8 MG PO TABS: 8.0000 mg | ORAL_TABLET | Freq: Three times a day (TID) | ORAL | 0 refills | Status: DC | PRN

## 2016-12-28 MED ORDER — DIPHENHYDRAMINE HCL 50 MG/ML IJ SOLN
25.0000 mg | Freq: Once | INTRAMUSCULAR | Status: AC
  Administered 2016-12-28: 25 mg via INTRAVENOUS
  Filled 2016-12-28: qty 1

## 2016-12-28 MED ORDER — MORPHINE SULFATE (PF) 4 MG/ML IV SOLN
4.0000 mg | Freq: Once | INTRAVENOUS | Status: AC
  Administered 2016-12-28: 4 mg via INTRAVENOUS
  Filled 2016-12-28: qty 1

## 2016-12-28 MED ORDER — OXYCODONE-ACETAMINOPHEN 5-325 MG PO TABS: 1.0000 | ORAL_TABLET | ORAL | 0 refills | Status: DC | PRN

## 2016-12-28 NOTE — ED Triage Notes (Signed)
Pt c/o chronic abdominal pain x 1 year, pt c/o generalized body aches. Pt denies n/v/d, but c/o intestinal cramps.

## 2016-12-28 NOTE — ED Notes (Signed)
ED Provider at bedside. 

## 2016-12-28 NOTE — ED Notes (Signed)
Patient c/o itching to bilat legs and buttocks.   Made Dr Lacinda Axon aware.  Verbal order for Benadryl 25mg  via IV.

## 2016-12-28 NOTE — ED Notes (Signed)
EDP Lacinda Axon made aware of patient in pain and requesting to see him.

## 2016-12-28 NOTE — ED Notes (Signed)
Patient son coming out in hallway saying that patient is dying and wants to know what is going on with her.  I informed patient's son that patient is not dying and that I have made Dr Lacinda Axon aware that patient is in pain.  RN told patient that I needed to update her vital signs.  Patient states, "do not put that damn thing on me, I am hurting to badly!"  RN unable to obtain vital signs at this time.

## 2016-12-28 NOTE — ED Notes (Signed)
Bed: WA20 Expected date:  Expected time:  Means of arrival:  Comments: 

## 2016-12-28 NOTE — ED Provider Notes (Signed)
[facility] DEPT Provider Note   CSN: [id number] Arrival date & time: 12/28/16  0913     History   Chief Complaint Chief Complaint  Patient presents with  . Abdominal Pain    HPI QUINCEY NORED is a 46 y.o. female.  Patient complains of generalized abdominal pain for several months described as cramping, getting worse. Status post uterine ablation in the fall of 2017 and BTL. No other surgical history. No vomiting, diarrhea, dysuria, fever, sweats, chills, chest pain, dyspnea..  Normal bowel movement recently.       Past Medical History:  Diagnosis Date  . Arthritis   . Bipolar 1 disorder ([place])   . Colon polyp   . Diabetes mellitus without complication ([place])   . GERD (gastroesophageal reflux disease)   . Hypercholesteremia   . Hypertension   . IBS (irritable bowel syndrome)   . Kidney stone   . Lung nodule   . Melanoma ([place])   . Migraine   . PTSD (post-traumatic stress disorder)   . Sleep apnea     There are no active problems to display for this patient.   History reviewed. No pertinent surgical history.  OB History    No data available       Home Medications    Prior to Admission medications   Medication Sig Start Date End Date Taking? Authorizing Provider  aspirin EC 81 MG tablet Take 81 mg by mouth daily.    Historical Provider, MD  atenolol (TENORMIN) 50 MG tablet Take 50 mg by mouth daily.    Historical Provider, MD  cephALEXin (KEFLEX) 250 MG capsule Take 1 capsule (250 mg total) by mouth 4 (four) times daily. 09/29/16   Okey Regal, PA-C  Cholecalciferol (VITAMIN D3) 5000 units TABS Take 1 tablet by mouth 3 (three) times a week. MWF    Historical Provider, MD  Cinnamon 500 MG capsule Take 500 mg by mouth 2 (two) times daily.    Historical Provider, MD  dicyclomine (BENTYL) 20 MG tablet Take 20 mg by mouth 4 (four) times daily.    Historical Provider, MD  DULoxetine (CYMBALTA) 60 MG capsule Take 60 mg by mouth 2 (two) times daily.     Historical Provider, MD  fenofibrate 160 MG tablet Take 160 mg by mouth daily.    Historical Provider, MD  gabapentin (NEURONTIN) 600 MG tablet Take 1,200 mg by mouth 2 (two) times daily.    Historical Provider, MD  glipiZIDE (GLUCOTROL XL) 5 MG 24 hr tablet Take 5 mg by mouth 2 (two) times daily.    Historical Provider, MD  HYDROcodone-acetaminophen (NORCO/VICODIN) 5-325 MG tablet Take 1-2 tablets by mouth every 4 (four) hours as needed for moderate pain or severe pain. Patient not taking: Reported on 09/29/2016 09/06/16   Duffy Bruce, MD  Magnesium 500 MG TABS Take 1 tablet by mouth 2 (two) times daily.    Historical Provider, MD  Melatonin 3 MG TABS Take 4 tablets by mouth at bedtime.    Historical Provider, MD  metFORMIN (GLUCOPHAGE) 1000 MG tablet Take 1,000 mg by mouth 2 (two) times daily with a meal.    Historical Provider, MD  Omega-3 Fatty Acids (FISH OIL) 1200 MG CAPS Take 1 capsule by mouth daily.    Historical Provider, MD  omeprazole (PRILOSEC) 40 MG capsule Take 40 mg by mouth 2 (two) times daily.    Historical Provider, MD  ondansetron (ZOFRAN) 8 MG tablet Take 1 tablet (8 mg total) by mouth every 8 (  eight) hours as needed for nausea or vomiting. 12/28/16   Nat Christen, MD  oxyCODONE-acetaminophen (PERCOCET) 5-325 MG tablet Take 1-2 tablets by mouth every 4 (four) hours as needed. 12/28/16   Nat Christen, MD  pioglitazone (ACTOS) 15 MG tablet Take 15 mg by mouth daily.    Historical Provider, MD  pravastatin (PRAVACHOL) 40 MG tablet Take 40 mg by mouth daily.    Historical Provider, MD  Turmeric 500 MG TABS Take 1 tablet by mouth 2 (two) times daily.    Historical Provider, MD    Family History History reviewed. No pertinent family history.  Social History Social History  Substance Use Topics  . Smoking status: Current Some Day Smoker  . Smokeless tobacco: Never Used  . Alcohol use Yes     Allergies   Iohexol; Ibuprofen; Lamictal [lamotrigine]; Lyrica [pregabalin];  Naproxen; and Nsaids   Review of Systems Review of Systems  All other systems reviewed and are negative.    Physical Exam Updated Vital Signs BP 129/80 (BP Location: Right Arm)   Pulse 66   Temp 97.6 F (36.4 C) (Oral)   Resp 18   SpO2 95%   Physical Exam  Constitutional: She is oriented to person, place, and time.  Appears to be in pain.  HENT:  Head: Normocephalic and atraumatic.  Eyes: Conjunctivae are normal.  Neck: Neck supple.  Cardiovascular: Normal rate and regular rhythm.   Pulmonary/Chest: Effort normal and breath sounds normal.  Abdominal: Soft. Bowel sounds are normal.  Musculoskeletal: Normal range of motion.  Neurological: She is alert and oriented to person, place, and time.  Skin: Skin is warm and dry.  Psychiatric: She has a normal mood and affect. Her behavior is normal.  Nursing note and vitals reviewed.    ED Treatments / Results  Labs (all labs ordered are listed, but only abnormal results are displayed) Labs Reviewed  CBC WITH DIFFERENTIAL/PLATELET - Abnormal; Notable for the following:       Result Value   Hemoglobin 10.3 (*)    HCT 32.9 (*)    MCV 72.3 (*)    MCH 22.6 (*)    RDW 17.3 (*)    All other components within normal limits  COMPREHENSIVE METABOLIC PANEL - Abnormal; Notable for the following:    Potassium 3.4 (*)    CO2 21 (*)    Glucose, Bld 125 (*)    Calcium 8.7 (*)    All other components within normal limits  URINALYSIS, ROUTINE W REFLEX MICROSCOPIC - Abnormal; Notable for the following:    Leukocytes, UA TRACE (*)    Bacteria, UA RARE (*)    Squamous Epithelial / LPF 0-5 (*)    All other components within normal limits  LIPASE, BLOOD    EKG  EKG Interpretation None       Radiology Ct Abdomen Pelvis Wo Contrast  Result Date: 12/28/2016 CLINICAL DATA:  Chronic abdominal pain for 1 year. EXAM: CT ABDOMEN AND PELVIS WITHOUT CONTRAST TECHNIQUE: Multidetector CT imaging of the abdomen and pelvis was performed  following the standard protocol without IV contrast. COMPARISON:  Abdominal series radiograph 07/17/2016 FINDINGS: Lower chest: No acute abnormality. Hepatobiliary: No focal liver abnormality is seen. No gallstones, gallbladder wall thickening, or biliary dilatation. Pancreas: Unremarkable. No pancreatic ductal dilatation or surrounding inflammatory changes. Spleen: Normal in size without focal abnormality. Adrenals/Urinary Tract: Adrenal glands are unremarkable. Kidneys are without focal lesion, or hydronephrosis. Tiny 2 mm nonobstructive renal calculus is seen in the lower pole of  the left kidney. Bladder is unremarkable. Stomach/Bowel: Stomach is within normal limits. Appendix appears normal. No evidence of bowel wall thickening, distention, or inflammatory changes. Scattered colonic diverticulosis. Vascular/Lymphatic: Aortic atherosclerosis. No enlarged abdominal or pelvic lymph nodes. Reproductive: Uterus and bilateral adnexa are unremarkable. Other: No abdominal wall hernia or abnormality. No abdominopelvic ascites. Musculoskeletal: No acute or significant osseous findings. IMPRESSION: No evidence of acute abnormality within the abdomen or pelvis. Tiny 2 mm nonobstructive left renal calculus. Electronically Signed   By: Fidela Salisbury M.D.   On: 12/28/2016 15:01    Procedures Procedures (including critical care time)  Medications Ordered in ED Medications  morphine 4 MG/ML injection 4 mg (not administered)  ondansetron (ZOFRAN) injection 4 mg (not administered)  ondansetron (ZOFRAN) injection 4 mg (4 mg Intravenous Given 12/28/16 1157)  sodium chloride 0.9 % bolus 1,000 mL (0 mLs Intravenous Stopped 12/28/16 1412)  HYDROmorphone (DILAUDID) injection 1 mg (1 mg Intravenous Given 12/28/16 1157)  HYDROmorphone (DILAUDID) injection 1 mg (1 mg Intravenous Given 12/28/16 1242)  diphenhydrAMINE (BENADRYL) injection 25 mg (25 mg Intravenous Given 12/28/16 1418)  barium (READI-CAT 2) 2 % suspension 450  mL (450 mLs Oral Given 12/28/16 1434)  morphine 4 MG/ML injection 4 mg (4 mg Intravenous Given 12/28/16 1453)     Initial Impression / Assessment and Plan / ED Course  I have reviewed the triage vital signs and the nursing notes.  Pertinent labs & imaging results that were available during my care of the patient were reviewed by me and considered in my medical decision making (see chart for details).     Patient given IV fluids and pain medicine. Extensive workup including labs, CT scan, urinalysis showed no acute findings. Patient has dropped 2 g of hemoglobin in the last several months. All these findings were discussed with the patient, her friend, her daughter. No acute abdomen noted. She will follow-up with her primary care doctor. Discharge medications Percocet and Zofran 8 mg. No acute abdomen at discharge.  Final Clinical Impressions(s) / ED Diagnoses   Final diagnoses:  Abdominal pain, unspecified abdominal location    New Prescriptions New Prescriptions   ONDANSETRON (ZOFRAN) 8 MG TABLET    Take 1 tablet (8 mg total) by mouth every 8 (eight) hours as needed for nausea or vomiting.   OXYCODONE-ACETAMINOPHEN (PERCOCET) 5-325 MG TABLET    Take 1-2 tablets by mouth every 4 (four) hours as needed.     Nat Christen, MD 12/28/16 [phone]

## 2016-12-28 NOTE — ED Triage Notes (Signed)
Unable to get BP due to patient ripping BP cuff off and angry that she is not being seen.

## 2016-12-28 NOTE — ED Notes (Signed)
Patient ambulated with family from room to Ed exit without any difficulty

## 2016-12-28 NOTE — Discharge Instructions (Signed)
You are slightly anemic. However, the rest of your tests were good including your CT scan. You will need to follow-up with your primary care doctor. Prescriptions for pain and nausea medicine. Clear liquids tonight.

## 2017-01-06 DIAGNOSIS — K219 Gastro-esophageal reflux disease without esophagitis: Secondary | ICD-10-CM | POA: Diagnosis not present

## 2017-01-06 DIAGNOSIS — D509 Iron deficiency anemia, unspecified: Secondary | ICD-10-CM | POA: Diagnosis not present

## 2017-01-06 DIAGNOSIS — K589 Irritable bowel syndrome without diarrhea: Secondary | ICD-10-CM | POA: Diagnosis not present

## 2017-01-06 DIAGNOSIS — R109 Unspecified abdominal pain: Secondary | ICD-10-CM | POA: Diagnosis not present

## 2017-01-14 DIAGNOSIS — D649 Anemia, unspecified: Secondary | ICD-10-CM | POA: Diagnosis not present

## 2017-01-19 DIAGNOSIS — M25532 Pain in left wrist: Secondary | ICD-10-CM | POA: Diagnosis not present

## 2017-01-19 DIAGNOSIS — M25531 Pain in right wrist: Secondary | ICD-10-CM | POA: Diagnosis not present

## 2017-01-20 DIAGNOSIS — D122 Benign neoplasm of ascending colon: Secondary | ICD-10-CM | POA: Diagnosis not present

## 2017-01-20 DIAGNOSIS — K219 Gastro-esophageal reflux disease without esophagitis: Secondary | ICD-10-CM | POA: Diagnosis not present

## 2017-01-20 DIAGNOSIS — Z8371 Family history of colonic polyps: Secondary | ICD-10-CM | POA: Diagnosis not present

## 2017-01-20 DIAGNOSIS — D509 Iron deficiency anemia, unspecified: Secondary | ICD-10-CM | POA: Diagnosis not present

## 2017-01-20 DIAGNOSIS — R1013 Epigastric pain: Secondary | ICD-10-CM | POA: Diagnosis not present

## 2017-01-20 DIAGNOSIS — K621 Rectal polyp: Secondary | ICD-10-CM | POA: Diagnosis not present

## 2017-01-23 ENCOUNTER — Ambulatory Visit: Payer: Medicare Other | Admitting: Neurology

## 2017-01-23 DIAGNOSIS — Z886 Allergy status to analgesic agent status: Secondary | ICD-10-CM | POA: Diagnosis not present

## 2017-01-23 DIAGNOSIS — E785 Hyperlipidemia, unspecified: Secondary | ICD-10-CM | POA: Diagnosis not present

## 2017-01-23 DIAGNOSIS — F17220 Nicotine dependence, chewing tobacco, uncomplicated: Secondary | ICD-10-CM | POA: Diagnosis not present

## 2017-01-23 DIAGNOSIS — Z888 Allergy status to other drugs, medicaments and biological substances status: Secondary | ICD-10-CM | POA: Diagnosis not present

## 2017-01-23 DIAGNOSIS — F419 Anxiety disorder, unspecified: Secondary | ICD-10-CM | POA: Diagnosis not present

## 2017-01-23 DIAGNOSIS — M1990 Unspecified osteoarthritis, unspecified site: Secondary | ICD-10-CM | POA: Diagnosis not present

## 2017-01-23 DIAGNOSIS — G4730 Sleep apnea, unspecified: Secondary | ICD-10-CM | POA: Diagnosis not present

## 2017-01-23 DIAGNOSIS — K219 Gastro-esophageal reflux disease without esophagitis: Secondary | ICD-10-CM | POA: Diagnosis not present

## 2017-01-23 DIAGNOSIS — E119 Type 2 diabetes mellitus without complications: Secondary | ICD-10-CM | POA: Diagnosis not present

## 2017-01-23 DIAGNOSIS — R1084 Generalized abdominal pain: Secondary | ICD-10-CM | POA: Diagnosis not present

## 2017-01-23 DIAGNOSIS — Z7982 Long term (current) use of aspirin: Secondary | ICD-10-CM | POA: Diagnosis not present

## 2017-01-23 DIAGNOSIS — Z91041 Radiographic dye allergy status: Secondary | ICD-10-CM | POA: Diagnosis not present

## 2017-01-23 DIAGNOSIS — F17210 Nicotine dependence, cigarettes, uncomplicated: Secondary | ICD-10-CM | POA: Diagnosis not present

## 2017-01-23 DIAGNOSIS — G8929 Other chronic pain: Secondary | ICD-10-CM | POA: Diagnosis not present

## 2017-01-23 DIAGNOSIS — Z720 Tobacco use: Secondary | ICD-10-CM | POA: Diagnosis not present

## 2017-01-23 DIAGNOSIS — B9689 Other specified bacterial agents as the cause of diseases classified elsewhere: Secondary | ICD-10-CM | POA: Diagnosis not present

## 2017-01-23 DIAGNOSIS — Z79899 Other long term (current) drug therapy: Secondary | ICD-10-CM | POA: Diagnosis not present

## 2017-01-23 DIAGNOSIS — Z7951 Long term (current) use of inhaled steroids: Secondary | ICD-10-CM | POA: Diagnosis not present

## 2017-01-23 DIAGNOSIS — M797 Fibromyalgia: Secondary | ICD-10-CM | POA: Diagnosis not present

## 2017-01-23 DIAGNOSIS — N390 Urinary tract infection, site not specified: Secondary | ICD-10-CM | POA: Diagnosis not present

## 2017-01-23 DIAGNOSIS — Z7984 Long term (current) use of oral hypoglycemic drugs: Secondary | ICD-10-CM | POA: Diagnosis not present

## 2017-01-23 DIAGNOSIS — I10 Essential (primary) hypertension: Secondary | ICD-10-CM | POA: Diagnosis not present

## 2017-01-23 DIAGNOSIS — E1140 Type 2 diabetes mellitus with diabetic neuropathy, unspecified: Secondary | ICD-10-CM | POA: Diagnosis not present

## 2017-01-23 DIAGNOSIS — F319 Bipolar disorder, unspecified: Secondary | ICD-10-CM | POA: Diagnosis not present

## 2017-01-23 DIAGNOSIS — R1030 Lower abdominal pain, unspecified: Secondary | ICD-10-CM | POA: Diagnosis not present

## 2017-01-23 DIAGNOSIS — Z85820 Personal history of malignant melanoma of skin: Secondary | ICD-10-CM | POA: Diagnosis not present

## 2017-02-04 ENCOUNTER — Other Ambulatory Visit: Payer: Self-pay | Admitting: Obstetrics and Gynecology

## 2017-02-04 ENCOUNTER — Other Ambulatory Visit (HOSPITAL_COMMUNITY)
Admission: RE | Admit: 2017-02-04 | Discharge: 2017-02-04 | Disposition: A | Discharge status: Home / Self Care | Payer: PPO | Source: Ambulatory Visit | Attending: Obstetrics and Gynecology | Admitting: Obstetrics and Gynecology

## 2017-02-04 DIAGNOSIS — R102 Pelvic and perineal pain: Secondary | ICD-10-CM | POA: Diagnosis not present

## 2017-02-04 DIAGNOSIS — Z124 Encounter for screening for malignant neoplasm of cervix: Secondary | ICD-10-CM | POA: Diagnosis not present

## 2017-02-04 DIAGNOSIS — Z1151 Encounter for screening for human papillomavirus (HPV): Secondary | ICD-10-CM | POA: Diagnosis not present

## 2017-02-04 DIAGNOSIS — M791 Myalgia: Secondary | ICD-10-CM | POA: Diagnosis not present

## 2017-02-04 DIAGNOSIS — M25531 Pain in right wrist: Secondary | ICD-10-CM | POA: Diagnosis not present

## 2017-02-04 DIAGNOSIS — Z01419 Encounter for gynecological examination (general) (routine) without abnormal findings: Secondary | ICD-10-CM | POA: Insufficient documentation

## 2017-02-05 LAB — CYTOLOGY - PAP
Diagnosis: NEGATIVE
HPV: NOT DETECTED

## 2017-02-10 ENCOUNTER — Encounter: Payer: Self-pay | Admitting: Neurology

## 2017-02-10 ENCOUNTER — Ambulatory Visit (INDEPENDENT_AMBULATORY_CARE_PROVIDER_SITE_OTHER): Payer: PPO | Admitting: Neurology

## 2017-02-10 VITALS — BP 134/70 | HR 71 | Ht 63.0 in | Wt 202.0 lb

## 2017-02-10 DIAGNOSIS — G43009 Migraine without aura, not intractable, without status migrainosus: Secondary | ICD-10-CM

## 2017-02-10 DIAGNOSIS — E1142 Type 2 diabetes mellitus with diabetic polyneuropathy: Secondary | ICD-10-CM

## 2017-02-10 DIAGNOSIS — M797 Fibromyalgia: Secondary | ICD-10-CM | POA: Diagnosis not present

## 2017-02-10 DIAGNOSIS — F17210 Nicotine dependence, cigarettes, uncomplicated: Secondary | ICD-10-CM

## 2017-02-10 DIAGNOSIS — F329 Major depressive disorder, single episode, unspecified: Secondary | ICD-10-CM

## 2017-02-10 DIAGNOSIS — F32A Depression, unspecified: Secondary | ICD-10-CM

## 2017-02-10 DIAGNOSIS — G4750 Parasomnia, unspecified: Secondary | ICD-10-CM

## 2017-02-10 DIAGNOSIS — M5030 Other cervical disc degeneration, unspecified cervical region: Secondary | ICD-10-CM

## 2017-02-10 MED ORDER — RIZATRIPTAN BENZOATE 10 MG PO TABS: ORAL_TABLET | ORAL | 3 refills | Status: DC

## 2017-02-10 MED ORDER — GABAPENTIN 600 MG PO TABS: ORAL_TABLET | ORAL | 3 refills | Status: DC

## 2017-02-10 MED ORDER — ATENOLOL 100 MG PO TABS: 100.0000 mg | ORAL_TABLET | Freq: Every day | ORAL | 3 refills | Status: DC

## 2017-02-10 NOTE — Patient Instructions (Signed)
1.  Increase atenolol to 100mg  daily to help reduce frequency of migraines 2.  Take rizatriptan 10mg  at earliest onset of migraine. May repeat once after 2 hours if needed.  Do not exceed two tablets in 24 hours 3.  Limit use of all pain relievers to no more than 2 days out of week to prevent rebound headache 4.  Increase gabapentin 600mg  to 5 capsules daily 5.  Follow up with pain specialist 6.  Follow up with sleep specialist 7.  Follow up with therapist 8.  Okay to try acupuncture 9.  Follow up in 3 months.

## 2017-02-10 NOTE — Progress Notes (Signed)
NEUROLOGY CONSULTATION NOTE  Stacy Guzman MRN: [id number] DOB: 1971/09/22  Referring provider: Dr. Laurann Montana Primary care provider: Dr. Laurann Montana  Reason for consult:  migraine  HISTORY OF PRESENT ILLNESS: Stacy Guzman is a 46 year old female with type 2 diabetes, hyperlipidemia, depression, anxiety, Bipolar disorder, hypertension, IBS, fibromyalgia, lumbar spondylosis, sleep apnea and PTSD who presents for migraines.  History supplemented by PCP note.  Onset:  Since teenager Location:  Bi-occipital radiating to bifrontal region Quality:  Vice-like Intensity:  "12"/10 Aura:  no Prodrome:  no Postdrome:  no Associated symptoms:  Photophobia, phonophobia.  No nausea or visual disturbance.  She has not had any new worse headache of her life, waking up from sleep Duration:  5 hours (sometimes days) Frequency:  10 days per month, worse during her cycle Triggers/exacerbating factors:  Hormonal, chocolate, caffeine, stress Relieving factors:  rest Activity:  aggravates  Past NSAIDS:  All NSAIDS (ibuprofen, naproxen) make headache worse Past analgesics:  Tylenol and Excedrin make headache worse), tramadol Past abortive triptans:  Sumatriptan (/tab), Relpax, Maxalt (effective) Past muscle relaxants:  cyclobenzaprine Past anti-emetic:  no Past antihypertensive medications:  no Past antidepressant medications:  Cymbalta, amitriptyline, Effexor (side effects) Past anticonvulsant medications:  topiramate (side effects), Depakote (maybe many years ago) Past vitamins/Herbal/Supplements:  no Past antihistamines/decongestants:  no Other past therapies:  Cervical or occipital ablations, Botox (worked but could not afford and does not like needles)  Current NSAIDS:  no Current analgesics:  no Current triptans:  no Current anti-emetic:  no Current muscle relaxants:  no Current anti-anxiolytic:  no Current sleep aide:  melatonin 3mg  Current Antihypertensive medications:  atenolol  50mg  Current Antidepressant medications:  no Current Anticonvulsant medications:  gabapentin 1200mg  twice daily (usually takes 1800mg  in AM and 1200mg  in PM, which works better for neuropathy) Current Vitamins/Herbal/Supplements:  melatonin 3mg , Turmeric, magnesium, D3 Current Antihistamines/Decongestants:  Benadryl Other therapy:  no  Caffeine:  rarely Alcohol:  no Smoker:  Yes (cigarettes and marijuana) Diet:  hydrates Exercise:  walks Depression/anxiety:  Yes.  Significant stressors over past year (divorce after over 29 years, her dad passed away, financial hardship).  She intentionally overdosed a year ago.  She has no suicidal ideation currently. Sleep hygiene:  Okay, however she sleep walks.  She sees a sleep specialist. Family history of headache:  yes  12/28/16 Labs:  CMP with Na 139, K 3.4, Cl 111, CO2 21, glucose 125, BUN 11, Cr 0.48, total bili 0.6, ALP 51, AST 18,, and ALT 15.  PAST MEDICAL HISTORY: Past Medical History:  Diagnosis Date  . Arthritis   . Bipolar 1 disorder ([place])   . Colon polyp   . Diabetes mellitus without complication ([place])   . GERD (gastroesophageal reflux disease)   . Hypercholesteremia   . Hypertension   . IBS (irritable bowel syndrome)   . Kidney stone   . Lung nodule   . Melanoma ([place])   . Migraine   . PTSD (post-traumatic stress disorder)   . Sleep apnea     PAST SURGICAL HISTORY: Past Surgical History:  Procedure Laterality Date  . DILATION AND CURETTAGE OF UTERUS    . KNEE ARTHROSCOPY    . THROAT SURGERY    . TUBAL LIGATION  12/03/2015    MEDICATIONS: Current Outpatient Prescriptions on File Prior to Visit  Medication Sig Dispense Refill  . aspirin EC 81 MG tablet Take 81 mg by mouth daily.    . Cholecalciferol (VITAMIN D3) 5000 units TABS Take 1  tablet by mouth 3 (three) times a week. MWF    . dicyclomine (BENTYL) 20 MG tablet Take 20 mg by mouth 4 (four) times daily.    Marland Kitchen glipiZIDE (GLUCOTROL XL) 5 MG 24 hr tablet Take 5 mg by  mouth 2 (two) times daily.    . Magnesium 500 MG TABS Take 1 tablet by mouth 2 (two) times daily.    . Melatonin 3 MG TABS Take 4 tablets by mouth at bedtime.    . metFORMIN (GLUCOPHAGE) 1000 MG tablet Take 1,000 mg by mouth 2 (two) times daily with a meal.    . Omega-3 Fatty Acids (FISH OIL) 1200 MG CAPS Take 1 capsule by mouth daily.    Marland Kitchen omeprazole (PRILOSEC) 40 MG capsule Take 40 mg by mouth 2 (two) times daily.    . Turmeric 500 MG TABS Take 1 tablet by mouth 2 (two) times daily.     No current facility-administered medications on file prior to visit.     ALLERGIES: Allergies  Allergen Reactions  . Iohexol Anaphylaxis    Pt states she received dye at [facility] and developed airway/ tongue swelling   . Celebrex [Celecoxib]     Mood changes  . Crestor [Rosuvastatin Calcium]     Muscle pain/hives  . Ibuprofen     Migraine  . Keflex [Cephalexin]     Nausea/yeast infection  . Lamictal [Lamotrigine]     unknown  . Lyrica [Pregabalin]   . Meloxicam     Stomach problems  . Naproxen     Stomach pain  . Nsaids   . Voltaren [Diclofenac Sodium]     FAMILY HISTORY: History reviewed. No pertinent family history.  SOCIAL HISTORY: Social History   Social History  . Marital status: Married    Spouse name: N/A  . Number of children: N/A  . Years of education: N/A   Occupational History  . Not on file.   Social History Main Topics  . Smoking status: Current Some Day Smoker  . Smokeless tobacco: Never Used  . Alcohol use Yes  . Drug use: Yes    Types: Marijuana     Comment: pain relief  . Sexual activity: Yes    Partners: Male   Other Topics Concern  . Not on file   Social History Narrative  . No narrative on file    REVIEW OF SYSTEMS: Constitutional: No fevers, chills, or sweats, no generalized fatigue, change in appetite Eyes: No visual changes, double vision, eye pain Ear, nose and throat: No hearing loss, ear pain, nasal congestion, sore  throat Cardiovascular: No chest pain, palpitations Respiratory:  No shortness of breath at rest or with exertion, wheezes GastrointestinaI: No nausea, vomiting, diarrhea, abdominal pain, fecal incontinence Genitourinary:  No dysuria, urinary retention or frequency Musculoskeletal:  Neck pain, back pain Integumentary: No rash, pruritus, skin lesions Neurological: as above Psychiatric: depression, anxiety Endocrine: No palpitations, fatigue, diaphoresis, mood swings, change in appetite, change in weight, increased thirst Hematologic/Lymphatic:  No purpura, petechiae. Allergic/Immunologic: no itchy/runny eyes, nasal congestion, recent allergic reactions, rashes  PHYSICAL EXAM: Vitals:   02/10/17 1306  BP: 134/70  Pulse: 71   General: No acute distress.  Patient appears well-groomed.  Head:  Normocephalic/atraumatic Eyes:  fundi examined but not visualized Neck: supple, paraspinal tenderness, full range of motion Back: paraspinal tenderness Heart: regular rate and rhythm Lungs: Clear to auscultation bilaterally. Vascular: No carotid bruits. Neurological Exam: Mental status: alert and oriented to person, place, and time, recent  and remote memory intact, fund of knowledge intact, attention and concentration intact, speech fluent and not dysarthric, language intact. Cranial nerves: CN I: not tested CN II: pupils equal, round and reactive to light, visual fields intact CN III, IV, VI:  full range of motion, no nystagmus, no ptosis CN V: facial sensation intact CN VII: upper and lower face symmetric CN VIII: hearing intact CN IX, X: gag intact, uvula midline CN XI: sternocleidomastoid and trapezius muscles intact CN XII: tongue midline Bulk & Tone: normal, no fasciculations. Motor:  5/5 throughout  Sensation: temperature and vibration sensation intact. Deep Tendon Reflexes:  2+ throughout, toes downgoing.  Finger to nose testing:  Without dysmetria.  Heel to shin:  Without  dysmetria.  Gait:  Normal station and stride.  Able to turn and tandem walk. Romberg negative.  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  Increase atenolol to 100mg  daily to help reduce frequency of migraines 2.  Take rizatriptan 10mg  at earliest onset of migraine. May repeat once after 2 hours if needed.  Do not exceed two tablets in 24 hours 3.  Limit use of all pain relievers to no more than 2 days out of week to prevent rebound headache 4.  Increase gabapentin 600mg  to 5 capsules daily 5.  Follow up with pain specialist 6.  Follow up with sleep specialist 7.  Follow up with therapist 8.  Discussed smoking cessation 9.  Follow up in 3 months.  45 minutes spent face to face with patient, over 50% spent discussing management.  Thank you for allowing me to take part in the care of this patient.  Metta Clines, DO  CC: Stacy Pillar, MD

## 2017-02-10 NOTE — Progress Notes (Signed)
Done

## 2017-03-02 DIAGNOSIS — N83201 Unspecified ovarian cyst, right side: Secondary | ICD-10-CM | POA: Diagnosis not present

## 2017-03-02 DIAGNOSIS — R102 Pelvic and perineal pain: Secondary | ICD-10-CM | POA: Diagnosis not present

## 2017-03-19 ENCOUNTER — Encounter (HOSPITAL_COMMUNITY): Payer: Self-pay | Admitting: Emergency Medicine

## 2017-03-19 ENCOUNTER — Emergency Department (HOSPITAL_COMMUNITY)
Admission: EM | Admit: 2017-03-19 | Discharge: 2017-03-19 | Disposition: A | Discharge status: Home / Self Care | Payer: PPO | Attending: Emergency Medicine | Admitting: Emergency Medicine

## 2017-03-19 DIAGNOSIS — R109 Unspecified abdominal pain: Secondary | ICD-10-CM | POA: Diagnosis not present

## 2017-03-19 DIAGNOSIS — G4482 Headache associated with sexual activity: Secondary | ICD-10-CM

## 2017-03-19 DIAGNOSIS — Z85828 Personal history of other malignant neoplasm of skin: Secondary | ICD-10-CM | POA: Diagnosis not present

## 2017-03-19 DIAGNOSIS — I10 Essential (primary) hypertension: Secondary | ICD-10-CM | POA: Insufficient documentation

## 2017-03-19 DIAGNOSIS — E119 Type 2 diabetes mellitus without complications: Secondary | ICD-10-CM | POA: Insufficient documentation

## 2017-03-19 DIAGNOSIS — Z7982 Long term (current) use of aspirin: Secondary | ICD-10-CM | POA: Diagnosis not present

## 2017-03-19 DIAGNOSIS — F1290 Cannabis use, unspecified, uncomplicated: Secondary | ICD-10-CM | POA: Diagnosis not present

## 2017-03-19 DIAGNOSIS — F17200 Nicotine dependence, unspecified, uncomplicated: Secondary | ICD-10-CM | POA: Insufficient documentation

## 2017-03-19 DIAGNOSIS — G894 Chronic pain syndrome: Secondary | ICD-10-CM | POA: Diagnosis not present

## 2017-03-19 DIAGNOSIS — R51 Headache: Secondary | ICD-10-CM | POA: Diagnosis not present

## 2017-03-19 DIAGNOSIS — R1084 Generalized abdominal pain: Secondary | ICD-10-CM | POA: Diagnosis not present

## 2017-03-19 DIAGNOSIS — Z7984 Long term (current) use of oral hypoglycemic drugs: Secondary | ICD-10-CM | POA: Insufficient documentation

## 2017-03-19 DIAGNOSIS — F1210 Cannabis abuse, uncomplicated: Secondary | ICD-10-CM | POA: Diagnosis not present

## 2017-03-19 DIAGNOSIS — Z79899 Other long term (current) drug therapy: Secondary | ICD-10-CM | POA: Diagnosis not present

## 2017-03-19 LAB — CBC
HCT: 49.4 % — ABNORMAL HIGH (ref 36.0–46.0)
Hemoglobin: 16.2 g/dL — ABNORMAL HIGH (ref 12.0–15.0)
MCH: 28.1 pg (ref 26.0–34.0)
MCHC: 32.8 g/dL (ref 30.0–36.0)
MCV: 85.6 fL (ref 78.0–100.0)
Platelets: 237 10*3/uL (ref 150–400)
RBC: 5.77 MIL/uL — ABNORMAL HIGH (ref 3.87–5.11)
RDW: 19.5 % — ABNORMAL HIGH (ref 11.5–15.5)
WBC: 10.2 10*3/uL (ref 4.0–10.5)

## 2017-03-19 LAB — URINALYSIS, ROUTINE W REFLEX MICROSCOPIC
Bilirubin Urine: NEGATIVE
Glucose, UA: NEGATIVE mg/dL
Hgb urine dipstick: NEGATIVE
Ketones, ur: NEGATIVE mg/dL
Nitrite: NEGATIVE
Protein, ur: NEGATIVE mg/dL
Specific Gravity, Urine: 1.005 (ref 1.005–1.030)
pH: 6 (ref 5.0–8.0)

## 2017-03-19 LAB — COMPREHENSIVE METABOLIC PANEL
ALT: 14 U/L (ref 14–54)
AST: 19 U/L (ref 15–41)
Albumin: 3.8 g/dL (ref 3.5–5.0)
Alkaline Phosphatase: 70 U/L (ref 38–126)
Anion gap: 10 (ref 5–15)
BUN: 6 mg/dL (ref 6–20)
CO2: 23 mmol/L (ref 22–32)
Calcium: 9 mg/dL (ref 8.9–10.3)
Chloride: 102 mmol/L (ref 101–111)
Creatinine, Ser: 0.56 mg/dL (ref 0.44–1.00)
GFR calc Af Amer: >60 mL/min (ref >60)
GFR calc non Af Amer: >60 mL/min (ref >60)
Glucose, Bld: 134 mg/dL — ABNORMAL HIGH (ref 65–99)
Potassium: 4.2 mmol/L (ref 3.5–5.1)
Sodium: 135 mmol/L (ref 135–145)
Total Bilirubin: 0.5 mg/dL (ref 0.3–1.2)
Total Protein: 6.5 g/dL (ref 6.5–8.1)

## 2017-03-19 LAB — LIPASE, BLOOD: Lipase: 39 U/L (ref 11–51)

## 2017-03-19 MED ORDER — SODIUM CHLORIDE 0.9 % IV BOLUS (SEPSIS): 1000.0000 mL | Freq: Once | INTRAVENOUS | Status: DC

## 2017-03-19 MED ORDER — LIDOCAINE 5 % EX PTCH
1.0000 | MEDICATED_PATCH | CUTANEOUS | Status: DC
  Administered 2017-03-19: 1 via TRANSDERMAL
  Filled 2017-03-19: qty 1

## 2017-03-19 NOTE — Discharge Instructions (Signed)
You  reported that having sexual relations seems to trigger your pains. Please stop having sexual relations until you can follow-up with your OB/GYN. Please stop using marijuana. Your abdominal pain may be worsened or triggered by your marijuana use. It may take a few weeks without marijuana for you to see any changes. Please call your neurologist, and primary care doctor for follow-up. Please make sure that you stay well-hydrated. If you have worsening symptoms or new concerning symptoms please feel free to return to the ER for repeat evaluation.

## 2017-03-19 NOTE — ED Notes (Signed)
Pt ambulates from lobby to pod E with no difficulty. Pt is in NAD upon arrival.

## 2017-03-19 NOTE — ED Notes (Signed)
Pt is in stable condition upon d/c and ambulates from ED. 
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
## 2017-03-19 NOTE — ED Triage Notes (Signed)
Pt reports hx of recurring migraines, migraine that began 3-4 days ago, states she has a pinched nerve in her left neck that is causing her pain as well. Also states abdominal pain, is supposed to follow up at [facility] for ongoing abdominal pain. Pt has multiple complaints in triage. Speech clear, NAD

## 2017-03-19 NOTE — ED Notes (Signed)
Pt has multiple complaints ranging from neck pain, abdominal pain and a headache. Pt is also being followed by multiple specialists. Pt remains in NAD.

## 2017-03-20 ENCOUNTER — Telehealth: Payer: Self-pay | Admitting: Neurology

## 2017-03-20 LAB — URINE CULTURE

## 2017-03-20 NOTE — Telephone Encounter (Signed)
Patient has a pinched nerve and it is giving her migraines.  Is there some kind of back brace that would help this?  She says that her back feels really compressed.  Can she also get nerve block in her neck?

## 2017-03-20 NOTE — Telephone Encounter (Signed)
Left message for patient to call me back. 

## 2017-03-20 NOTE — Telephone Encounter (Signed)
Patient needs to talk to someone about the swelling and pain she has been in please call her

## 2017-03-20 NOTE — Telephone Encounter (Signed)
Patient returning your call [phone]. Thanks

## 2017-03-22 NOTE — Telephone Encounter (Signed)
She needs to contact her PCP about this

## 2017-03-23 NOTE — Telephone Encounter (Signed)
Left message instructing patient to call her PCP.

## 2017-04-03 ENCOUNTER — Encounter (HOSPITAL_COMMUNITY): Payer: Self-pay | Admitting: Emergency Medicine

## 2017-04-03 ENCOUNTER — Emergency Department (HOSPITAL_COMMUNITY)
Admission: EM | Admit: 2017-04-03 | Discharge: 2017-04-03 | Disposition: A | Discharge status: Home / Self Care | Payer: PPO | Attending: Emergency Medicine | Admitting: Emergency Medicine

## 2017-04-03 DIAGNOSIS — B029 Zoster without complications: Secondary | ICD-10-CM

## 2017-04-03 DIAGNOSIS — M542 Cervicalgia: Secondary | ICD-10-CM | POA: Insufficient documentation

## 2017-04-03 DIAGNOSIS — R52 Pain, unspecified: Secondary | ICD-10-CM

## 2017-04-03 DIAGNOSIS — Z720 Tobacco use: Secondary | ICD-10-CM

## 2017-04-03 DIAGNOSIS — D751 Secondary polycythemia: Secondary | ICD-10-CM

## 2017-04-03 DIAGNOSIS — M549 Dorsalgia, unspecified: Secondary | ICD-10-CM | POA: Diagnosis not present

## 2017-04-03 DIAGNOSIS — G8929 Other chronic pain: Secondary | ICD-10-CM

## 2017-04-03 DIAGNOSIS — N390 Urinary tract infection, site not specified: Secondary | ICD-10-CM

## 2017-04-03 DIAGNOSIS — R112 Nausea with vomiting, unspecified: Secondary | ICD-10-CM | POA: Diagnosis not present

## 2017-04-03 LAB — URINALYSIS, ROUTINE W REFLEX MICROSCOPIC
Bilirubin Urine: NEGATIVE
Glucose, UA: NEGATIVE mg/dL
Hgb urine dipstick: NEGATIVE
Ketones, ur: NEGATIVE mg/dL
Nitrite: NEGATIVE
Protein, ur: NEGATIVE mg/dL
Specific Gravity, Urine: 1.005 (ref 1.005–1.030)
pH: 6 (ref 5.0–8.0)

## 2017-04-03 LAB — COMPREHENSIVE METABOLIC PANEL WITH GFR
ALT: 16 U/L (ref 14–54)
AST: 19 U/L (ref 15–41)
Albumin: 3.5 g/dL (ref 3.5–5.0)
Alkaline Phosphatase: 66 U/L (ref 38–126)
Anion gap: 10 (ref 5–15)
BUN: 5 mg/dL — ABNORMAL LOW (ref 6–20)
CO2: 22 mmol/L (ref 22–32)
Calcium: 8.8 mg/dL — ABNORMAL LOW (ref 8.9–10.3)
Chloride: 103 mmol/L (ref 101–111)
Creatinine, Ser: 0.52 mg/dL (ref 0.44–1.00)
GFR calc Af Amer: >60 mL/min
GFR calc non Af Amer: >60 mL/min
Glucose, Bld: 130 mg/dL — ABNORMAL HIGH (ref 65–99)
Potassium: 4.6 mmol/L (ref 3.5–5.1)
Sodium: 135 mmol/L (ref 135–145)
Total Bilirubin: 0.4 mg/dL (ref 0.3–1.2)
Total Protein: 6.1 g/dL — ABNORMAL LOW (ref 6.5–8.1)

## 2017-04-03 LAB — CBC WITH DIFFERENTIAL/PLATELET
Basophils Absolute: 0 K/uL (ref 0.0–0.1)
Basophils Relative: 0 %
Eosinophils Absolute: 0.1 K/uL (ref 0.0–0.7)
Eosinophils Relative: 1 %
HCT: 50 % — ABNORMAL HIGH (ref 36.0–46.0)
Hemoglobin: 16.4 g/dL — ABNORMAL HIGH (ref 12.0–15.0)
Lymphocytes Relative: 32 %
Lymphs Abs: 4 K/uL (ref 0.7–4.0)
MCH: 28.5 pg (ref 26.0–34.0)
MCHC: 32.8 g/dL (ref 30.0–36.0)
MCV: 87 fL (ref 78.0–100.0)
Monocytes Absolute: 0.8 K/uL (ref 0.1–1.0)
Monocytes Relative: 7 %
Neutro Abs: 7.4 K/uL (ref 1.7–7.7)
Neutrophils Relative %: 60 %
Platelets: 235 K/uL (ref 150–400)
RBC: 5.75 MIL/uL — ABNORMAL HIGH (ref 3.87–5.11)
RDW: 18.4 % — ABNORMAL HIGH (ref 11.5–15.5)
WBC: 12.4 K/uL — ABNORMAL HIGH (ref 4.0–10.5)

## 2017-04-03 LAB — LIPASE, BLOOD: Lipase: 48 U/L (ref 11–51)

## 2017-04-03 MED ORDER — FENTANYL CITRATE (PF) 100 MCG/2ML IJ SOLN
100.0000 ug | Freq: Once | INTRAMUSCULAR | Status: DC
  Filled 2017-04-03: qty 2

## 2017-04-03 MED ORDER — NITROFURANTOIN MONOHYD MACRO 100 MG PO CAPS: 100.0000 mg | ORAL_CAPSULE | Freq: Two times a day (BID) | ORAL | 0 refills | Status: DC

## 2017-04-03 MED ORDER — VALACYCLOVIR HCL 1 G PO TABS: 1000.0000 mg | ORAL_TABLET | Freq: Three times a day (TID) | ORAL | 9 refills | Status: DC

## 2017-04-03 MED ORDER — ONDANSETRON HCL 4 MG/2ML IJ SOLN
4.0000 mg | Freq: Once | INTRAMUSCULAR | Status: DC
  Filled 2017-04-03: qty 2

## 2017-04-03 NOTE — ED Triage Notes (Signed)
Pt presents with multiple complaints. States she has "knots" on the back of her neck and back. She has a pinched nerve in her neck. She has a sore on the top of her head. She has had N/V over past few days with abd pain. Has been evaluated multiple times by GI and nothing has been found. Also has been seen before for "pinched nerve"

## 2017-04-03 NOTE — Discharge Instructions (Addendum)
Get help right away if: The rash is on your face or nose. You have facial pain, pain around your eye area, or loss of feeling on one side of your face. You have ear pain or you have ringing in your ear. You have loss of taste. Your condition gets worse.

## 2017-04-03 NOTE — Discharge Planning (Signed)
[facility] consulted in regards to medication assistance.  Pt has insurance coverage and is not eligible for medication assistace programs through the hospital. She may contact pharmaceutical company website to see if pt assistance programs are availaible.  EDCM will provide GoodRx savings card for pt to present at pharmacy. No further CM needs communicated at this time.
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
## 2017-04-06 DIAGNOSIS — E1165 Type 2 diabetes mellitus with hyperglycemia: Secondary | ICD-10-CM | POA: Diagnosis not present

## 2017-04-06 DIAGNOSIS — I10 Essential (primary) hypertension: Secondary | ICD-10-CM | POA: Diagnosis not present

## 2017-04-06 DIAGNOSIS — N390 Urinary tract infection, site not specified: Secondary | ICD-10-CM | POA: Diagnosis not present

## 2017-04-06 DIAGNOSIS — B029 Zoster without complications: Secondary | ICD-10-CM | POA: Diagnosis not present

## 2017-04-22 ENCOUNTER — Other Ambulatory Visit: Payer: Self-pay | Admitting: Internal Medicine

## 2017-04-22 DIAGNOSIS — Z1231 Encounter for screening mammogram for malignant neoplasm of breast: Secondary | ICD-10-CM

## 2017-05-11 ENCOUNTER — Ambulatory Visit: Payer: PPO | Admitting: Neurology

## 2017-05-14 ENCOUNTER — Other Ambulatory Visit: Payer: Self-pay | Admitting: Neurology

## 2017-05-14 DIAGNOSIS — M2550 Pain in unspecified joint: Secondary | ICD-10-CM | POA: Diagnosis not present

## 2017-05-14 DIAGNOSIS — K219 Gastro-esophageal reflux disease without esophagitis: Secondary | ICD-10-CM | POA: Diagnosis not present

## 2017-05-14 DIAGNOSIS — R5383 Other fatigue: Secondary | ICD-10-CM | POA: Diagnosis not present

## 2017-05-14 DIAGNOSIS — R3989 Other symptoms and signs involving the genitourinary system: Secondary | ICD-10-CM | POA: Diagnosis not present

## 2017-05-18 ENCOUNTER — Ambulatory Visit
Admission: RE | Admit: 2017-05-18 | Discharge: 2017-05-18 | Disposition: A | Discharge status: Home / Self Care | Payer: PPO | Source: Ambulatory Visit | Attending: Internal Medicine | Admitting: Internal Medicine

## 2017-05-18 DIAGNOSIS — Z1231 Encounter for screening mammogram for malignant neoplasm of breast: Secondary | ICD-10-CM | POA: Diagnosis not present

## 2017-05-26 ENCOUNTER — Other Ambulatory Visit: Payer: Self-pay | Admitting: Family Medicine

## 2017-05-26 DIAGNOSIS — M797 Fibromyalgia: Secondary | ICD-10-CM | POA: Diagnosis not present

## 2017-05-26 DIAGNOSIS — E7800 Pure hypercholesterolemia, unspecified: Secondary | ICD-10-CM | POA: Diagnosis not present

## 2017-05-26 DIAGNOSIS — Z720 Tobacco use: Secondary | ICD-10-CM | POA: Diagnosis not present

## 2017-05-26 DIAGNOSIS — M5136 Other intervertebral disc degeneration, lumbar region: Secondary | ICD-10-CM | POA: Diagnosis not present

## 2017-05-26 DIAGNOSIS — E1149 Type 2 diabetes mellitus with other diabetic neurological complication: Secondary | ICD-10-CM | POA: Diagnosis not present

## 2017-06-02 ENCOUNTER — Other Ambulatory Visit: Payer: Self-pay | Admitting: Family Medicine

## 2017-06-02 ENCOUNTER — Ambulatory Visit
Admission: RE | Admit: 2017-06-02 | Discharge: 2017-06-02 | Disposition: A | Discharge status: Home / Self Care | Payer: PPO | Source: Ambulatory Visit | Attending: Family Medicine | Admitting: Family Medicine

## 2017-06-02 DIAGNOSIS — M5136 Other intervertebral disc degeneration, lumbar region: Secondary | ICD-10-CM

## 2017-06-02 DIAGNOSIS — M5127 Other intervertebral disc displacement, lumbosacral region: Secondary | ICD-10-CM | POA: Diagnosis not present

## 2017-06-02 DIAGNOSIS — M5126 Other intervertebral disc displacement, lumbar region: Secondary | ICD-10-CM | POA: Diagnosis not present

## 2017-06-02 DIAGNOSIS — M51369 Other intervertebral disc degeneration, lumbar region without mention of lumbar back pain or lower extremity pain: Secondary | ICD-10-CM

## 2017-06-09 DIAGNOSIS — N83201 Unspecified ovarian cyst, right side: Secondary | ICD-10-CM | POA: Diagnosis not present

## 2017-06-22 DIAGNOSIS — M545 Low back pain: Secondary | ICD-10-CM | POA: Diagnosis not present

## 2017-06-22 DIAGNOSIS — I10 Essential (primary) hypertension: Secondary | ICD-10-CM | POA: Diagnosis not present

## 2017-06-22 DIAGNOSIS — Z6834 Body mass index (BMI) 34.0-34.9, adult: Secondary | ICD-10-CM | POA: Diagnosis not present

## 2017-06-22 DIAGNOSIS — G8929 Other chronic pain: Secondary | ICD-10-CM | POA: Diagnosis not present

## 2017-07-15 ENCOUNTER — Encounter (HOSPITAL_COMMUNITY): Payer: Self-pay | Admitting: Emergency Medicine

## 2017-07-15 ENCOUNTER — Emergency Department (HOSPITAL_COMMUNITY)
Admission: EM | Admit: 2017-07-15 | Discharge: 2017-07-15 | Disposition: A | Discharge status: Home / Self Care | Payer: PPO | Attending: Emergency Medicine | Admitting: Emergency Medicine

## 2017-07-15 DIAGNOSIS — Z5321 Procedure and treatment not carried out due to patient leaving prior to being seen by health care provider: Secondary | ICD-10-CM | POA: Diagnosis not present

## 2017-07-15 DIAGNOSIS — R109 Unspecified abdominal pain: Secondary | ICD-10-CM | POA: Diagnosis present

## 2017-07-15 LAB — CBC
HCT: 49.7 % — ABNORMAL HIGH (ref 36.0–46.0)
Hemoglobin: 16.5 g/dL — ABNORMAL HIGH (ref 12.0–15.0)
MCH: 31.3 pg (ref 26.0–34.0)
MCHC: 33.2 g/dL (ref 30.0–36.0)
MCV: 94.3 fL (ref 78.0–100.0)
Platelets: 265 10*3/uL (ref 150–400)
RBC: 5.27 MIL/uL — ABNORMAL HIGH (ref 3.87–5.11)
RDW: 13.1 % (ref 11.5–15.5)
WBC: 10.6 10*3/uL — ABNORMAL HIGH (ref 4.0–10.5)

## 2017-07-15 LAB — URINALYSIS, ROUTINE W REFLEX MICROSCOPIC
Bilirubin Urine: NEGATIVE
Glucose, UA: NEGATIVE mg/dL
Hgb urine dipstick: NEGATIVE
Ketones, ur: NEGATIVE mg/dL
Nitrite: NEGATIVE
Protein, ur: NEGATIVE mg/dL
Specific Gravity, Urine: 1.009 (ref 1.005–1.030)
pH: 6 (ref 5.0–8.0)

## 2017-07-15 LAB — COMPREHENSIVE METABOLIC PANEL
ALT: 13 U/L — ABNORMAL LOW (ref 14–54)
AST: 16 U/L (ref 15–41)
Albumin: 3.6 g/dL (ref 3.5–5.0)
Alkaline Phosphatase: 81 U/L (ref 38–126)
Anion gap: 10 (ref 5–15)
BUN: 8 mg/dL (ref 6–20)
CO2: 21 mmol/L — ABNORMAL LOW (ref 22–32)
Calcium: 8.9 mg/dL (ref 8.9–10.3)
Chloride: 104 mmol/L (ref 101–111)
Creatinine, Ser: 0.61 mg/dL (ref 0.44–1.00)
GFR calc Af Amer: >60 mL/min (ref >60)
GFR calc non Af Amer: >60 mL/min (ref >60)
Glucose, Bld: 93 mg/dL (ref 65–99)
Potassium: 4.5 mmol/L (ref 3.5–5.1)
Sodium: 135 mmol/L (ref 135–145)
Total Bilirubin: 0.3 mg/dL (ref 0.3–1.2)
Total Protein: 6.6 g/dL (ref 6.5–8.1)

## 2017-07-15 LAB — LIPASE, BLOOD: Lipase: 46 U/L (ref 11–51)

## 2017-07-15 NOTE — ED Triage Notes (Signed)
Patient reports chronic low abdominal pain for several years with diarrhea worsened this week  , history of IBS , no emesis or fever.

## 2017-07-15 NOTE — ED Notes (Signed)
Patient came to [facility] and said that "if she was going to have to f*ing die, she was going outside to smoke a cigarette." So patient is outside smoking.

## 2017-07-16 ENCOUNTER — Ambulatory Visit
Admission: RE | Admit: 2017-07-16 | Discharge: 2017-07-16 | Disposition: A | Discharge status: Home / Self Care | Payer: PPO | Source: Ambulatory Visit | Attending: Physician Assistant | Admitting: Physician Assistant

## 2017-07-16 ENCOUNTER — Other Ambulatory Visit: Payer: Self-pay | Admitting: Physician Assistant

## 2017-07-16 DIAGNOSIS — R1010 Upper abdominal pain, unspecified: Secondary | ICD-10-CM | POA: Diagnosis not present

## 2017-07-16 DIAGNOSIS — R109 Unspecified abdominal pain: Secondary | ICD-10-CM

## 2017-07-16 DIAGNOSIS — Z720 Tobacco use: Secondary | ICD-10-CM | POA: Diagnosis not present

## 2017-07-29 DIAGNOSIS — R102 Pelvic and perineal pain: Secondary | ICD-10-CM | POA: Diagnosis not present

## 2017-07-29 DIAGNOSIS — G8929 Other chronic pain: Secondary | ICD-10-CM | POA: Diagnosis not present

## 2017-07-29 DIAGNOSIS — N941 Unspecified dyspareunia: Secondary | ICD-10-CM | POA: Diagnosis not present

## 2017-08-04 ENCOUNTER — Ambulatory Visit: Payer: PPO | Admitting: Physical Therapy

## 2017-08-05 DIAGNOSIS — M150 Primary generalized (osteo)arthritis: Secondary | ICD-10-CM | POA: Diagnosis not present

## 2017-08-05 DIAGNOSIS — M797 Fibromyalgia: Secondary | ICD-10-CM | POA: Diagnosis not present

## 2017-08-05 DIAGNOSIS — R5383 Other fatigue: Secondary | ICD-10-CM | POA: Diagnosis not present

## 2017-08-05 DIAGNOSIS — M2550 Pain in unspecified joint: Secondary | ICD-10-CM | POA: Diagnosis not present

## 2017-08-05 DIAGNOSIS — E669 Obesity, unspecified: Secondary | ICD-10-CM | POA: Diagnosis not present

## 2017-08-05 DIAGNOSIS — Z6833 Body mass index (BMI) 33.0-33.9, adult: Secondary | ICD-10-CM | POA: Diagnosis not present

## 2017-08-05 DIAGNOSIS — R7982 Elevated C-reactive protein (CRP): Secondary | ICD-10-CM | POA: Diagnosis not present

## 2017-08-13 ENCOUNTER — Ambulatory Visit (INDEPENDENT_AMBULATORY_CARE_PROVIDER_SITE_OTHER): Payer: PPO | Admitting: Neurology

## 2017-08-13 ENCOUNTER — Encounter: Payer: Self-pay | Admitting: Neurology

## 2017-08-13 VITALS — BP 112/76 | HR 52 | Ht 62.0 in | Wt 180.4 lb

## 2017-08-13 DIAGNOSIS — M797 Fibromyalgia: Secondary | ICD-10-CM | POA: Diagnosis not present

## 2017-08-13 DIAGNOSIS — R42 Dizziness and giddiness: Secondary | ICD-10-CM

## 2017-08-13 DIAGNOSIS — R51 Headache: Secondary | ICD-10-CM

## 2017-08-13 DIAGNOSIS — R001 Bradycardia, unspecified: Secondary | ICD-10-CM

## 2017-08-13 DIAGNOSIS — F17200 Nicotine dependence, unspecified, uncomplicated: Secondary | ICD-10-CM

## 2017-08-13 DIAGNOSIS — R519 Headache, unspecified: Secondary | ICD-10-CM

## 2017-08-13 DIAGNOSIS — G43719 Chronic migraine without aura, intractable, without status migrainosus: Secondary | ICD-10-CM | POA: Diagnosis not present

## 2017-08-13 MED ORDER — FREMANEZUMAB-VFRM 225 MG/1.5ML ~~LOC~~ SOSY
1.0000 "pen " | PREFILLED_SYRINGE | Freq: Once | SUBCUTANEOUS | Status: AC
  Administered 2017-08-13: 1 via SUBCUTANEOUS

## 2017-08-13 MED ORDER — ATENOLOL 50 MG PO TABS: 50.0000 mg | ORAL_TABLET | Freq: Every day | ORAL | 3 refills | Status: DC

## 2017-08-13 NOTE — Addendum Note (Signed)
Addended by: Clois Comber on: 08/13/2017 10:00 AM   Modules accepted: Orders

## 2017-08-13 NOTE — Addendum Note (Signed)
Addended by: Clois Comber on: 08/13/2017 09:43 AM   Modules accepted: Orders

## 2017-08-13 NOTE — Addendum Note (Signed)
Addended by: Clois Comber on: 08/13/2017 04:37 PM   Modules accepted: Orders

## 2017-08-13 NOTE — Patient Instructions (Addendum)
1.  We will check MRI of brain with and without contrast 2.  We will set you up for the monthly shot, Ajovy. 3.  We will send you to vestibular rehab for dizziness. 4.  Try to work on quitting smoking 5.  Decrease atenolol to 50mg  daily  6.  Follow up in 4 months.

## 2017-08-13 NOTE — Progress Notes (Signed)
NEUROLOGY FOLLOW UP OFFICE NOTE  Stacy Guzman [id number]  HISTORY OF PRESENT ILLNESS: Stacy Guzman is a 46 year old female with type 2 diabetes, hyperlipidemia, depression, anxiety, Bipolar disorder, hypertension, IBS, fibromyalgia, lumbar spondylosis, sleep apnea and PTSD who follows up for migraine.  UPDATE: Headaches and chronic pain are worse. Intensity:  severe Duration:  1 to 9 days Frequency:  10 to 15 days per month Frequency of abortive medication: none Current NSAIDS:  no Current analgesics:  no Current triptans:  rizatriptan 10mg  (ineffective) Current anti-emetic:  no Current muscle relaxants:  no Current anti-anxiolytic:  no Current sleep aide:  melatonin 3mg  Current Antihypertensive medications:  atenolol 100mg  Current Antidepressant medications:  no Current Anticonvulsant medications:  no Current Vitamins/Herbal/Supplements:  melatonin 3mg , Turmeric, magnesium, D3 Current Antihistamines/Decongestants:  Benadryl Other therapy:  no   Caffeine:  rarely Alcohol:  no Smoker:  Yes (cigarettes and marijuana) Diet:  hydrates Exercise:  walks Depression/anxiety:  Yes.  Significant stressors over past year (divorce after over 19 years, her dad passed away, financial hardship).  She intentionally overdosed a year ago.  She has no suicidal ideation currently. Sleep hygiene:  Okay, however she sleep walks.  She sees a sleep specialist.  HISTORY:  Onset:  Since teenager Location:  Bi-occipital radiating to bifrontal region Quality:  Vice-like Initial Intensity:  "12"/10 Aura:  no Prodrome:  no Postdrome:  no Associated symptoms:  Photophobia, phonophobia.  No nausea or visual disturbance.  She has not had any new worse headache of her life, waking up from sleep Initial Duration:  5 hours (sometimes days) Initial Frequency:  10 days per month, worse during her cycle Triggers/exacerbating factors:  Hormonal, chocolate, caffeine, stress Relieving factors:   rest Activity:  aggravates   Past NSAIDS:  All NSAIDS (ibuprofen, naproxen) make headache worse Past analgesics:  Tylenol and Excedrin make headache worse), tramadol Past abortive triptans:  Sumatriptan ([place]/tab), Relpax, Maxalt (effective) Past muscle relaxants:  cyclobenzaprine Past anti-emetic:  no Past antihypertensive medications:  no Past antidepressant medications:  Cymbalta, amitriptyline, Effexor (side effects) Past anticonvulsant medications:  topiramate (side effects), Depakote (maybe many years ago), gabapentin (abdominal pain) Past vitamins/Herbal/Supplements:  no Past antihistamines/decongestants:  no Other past therapies:  Cervical or occipital ablations, Botox (worked but could not afford and does not like needles)   Family history of headache:  yes  PAST MEDICAL HISTORY: Past Medical History:  Diagnosis Date  . Arthritis   . Bipolar 1 disorder ([place])   . Colon polyp   . Diabetes mellitus without complication ([place])   . GERD (gastroesophageal reflux disease)   . Hypercholesteremia   . Hypertension   . IBS (irritable bowel syndrome)   . Kidney stone   . Lung nodule   . Melanoma ([place])   . Migraine   . PTSD (post-traumatic stress disorder)   . Sleep apnea     MEDICATIONS: Current Outpatient Prescriptions on File Prior to Visit  Medication Sig Dispense Refill  . atenolol (TENORMIN) 100 MG tablet Take 1 tablet (100 mg total) by mouth daily. 30 tablet 2  . diphenhydrAMINE (BENADRYL) 25 MG tablet Take 25 mg by mouth as needed for allergies.     Marland Kitchen omeprazole (PRILOSEC) 40 MG capsule Take 40 mg by mouth daily.     . rizatriptan (MAXALT) 10 MG tablet Take 1 tablet earliest onset of migraine.  May repeat once in 2 hours if needed.  Do not exceed 2 tablets in 24 hours 10 tablet 2  .  aspirin EC 81 MG tablet Take 81 mg by mouth daily.    Marland Kitchen b complex vitamins tablet Take 1 tablet by mouth daily.    . Cholecalciferol (VITAMIN D3) 5000 units TABS Take 5,000 Units by mouth  daily. MWF     . CINNAMON PO Take 2 tablets by mouth every morning.    . dicyclomine (BENTYL) 20 MG tablet Take 20 mg by mouth 4 (four) times daily.    Marland Kitchen gabapentin (NEURONTIN) 600 MG tablet Take 3 capsules in AM and 2 capsules at bedtime (Patient not taking: Reported on 08/13/2017) 150 tablet 2  . Magnesium 500 MG TABS Take 500 mg by mouth daily.     . Melatonin 3 MG TABS Take 6 mg by mouth daily as needed (sleep).     . metFORMIN (GLUCOPHAGE) 1000 MG tablet Take 1,000 mg by mouth daily.     . mometasone-formoterol (DULERA) 100-5 MCG/ACT AERO Inhale 2 puffs into the lungs as needed for wheezing or shortness of breath.    . nitrofurantoin, macrocrystal-monohydrate, (MACROBID) 100 MG capsule Take 1 capsule (100 mg total) by mouth 2 (two) times daily. (Patient not taking: Reported on 08/13/2017) 14 capsule 0  . Omega-3 Fatty Acids (FISH OIL) 1200 MG CAPS Take 1,200 mg by mouth daily.     . Turmeric 500 MG TABS Take 500 mg by mouth 2 (two) times daily.     . valACYclovir (VALTREX) 1000 MG tablet Take 1 tablet (1,000 mg total) by mouth 3 (three) times daily. (Patient not taking: Reported on 08/13/2017) 30 tablet 9   No current facility-administered medications on file prior to visit.     ALLERGIES: Allergies  Allergen Reactions  . Iohexol Anaphylaxis    Pt states she received dye at [facility] and developed airway/ tongue swelling   . Other Anaphylaxis    IV contrast  . Acetaminophen Other (See Comments)    headaches  . Celebrex [Celecoxib]     Mood changes  . Chantix [Varenicline] Other (See Comments)    Mood changes  . Crestor [Rosuvastatin Calcium]     Muscle pain/hives  . Ibuprofen     Migraine  . Keflex [Cephalexin]     Nausea/yeast infection  . Lamictal [Lamotrigine]     unknown  . Lyrica [Pregabalin]   . Meloxicam     Stomach problems  . Metoclopramide Other (See Comments)    Mood changes  . Naproxen     Stomach pain  . Nsaids   . Prednisone Other (See  Comments)    Causes sugar lvl to rise  . Risperidone And Related Swelling  . Voltaren [Diclofenac Sodium]     FAMILY HISTORY: Family History  Problem Relation Age of Onset  . Breast cancer Mother     SOCIAL HISTORY: Social History   Social History  . Marital status: Married    Spouse name: N/A  . Number of children: N/A  . Years of education: N/A   Occupational History  . Not on file.   Social History Main Topics  . Smoking status: Current Some Day Smoker  . Smokeless tobacco: Never Used  . Alcohol use Yes  . Drug use: Yes    Types: Marijuana     Comment: pain relief  . Sexual activity: Yes    Partners: Male   Other Topics Concern  . Not on file   Social History Narrative  . No narrative on file    REVIEW OF SYSTEMS: Constitutional: No fevers, chills,  or sweats, no generalized fatigue, change in appetite Eyes: No visual changes, double vision, eye pain Ear, nose and throat: No hearing loss, ear pain, nasal congestion, sore throat Cardiovascular: No chest pain, palpitations Respiratory:  No shortness of breath at rest or with exertion, wheezes GastrointestinaI: No nausea, vomiting, diarrhea, abdominal pain, fecal incontinence Genitourinary:  No dysuria, urinary retention or frequency Musculoskeletal:  Generalized pain Integumentary: No rash, pruritus, skin lesions Neurological: as above Psychiatric: depression Endocrine: No palpitations, fatigue, diaphoresis, mood swings, change in appetite, change in weight, increased thirst Hematologic/Lymphatic:  No purpura, petechiae. Allergic/Immunologic: no itchy/runny eyes, nasal congestion, recent allergic reactions, rashes  PHYSICAL EXAM: Vitals:   08/13/17 0849  BP: 112/76  Pulse: (!) 52  SpO2: 95%   General: No acute distress.  Patient appears well-groomed.  Head:  Normocephalic/atraumatic Eyes:  Fundi examined but not visualized Neck: supple, paraspinal tenderness, full range of motion Heart:  Regular  rate and rhythm Lungs:  Clear to auscultation bilaterally Back: pain Neurological Exam: alert and oriented to person, place, and time. Attention span and concentration intact, recent and remote memory intact, fund of knowledge intact.  Speech fluent and not dysarthric, language intact.  CN II-XII intact. Bulk and tone normal, muscle strength 5/5 throughout.  Sensation to light touch  intact.  Deep tendon reflexes 2+ throughout.  Finger to nose testing intact.  Gait normal  IMPRESSION: Chronic migraine with worsening headache Episodic vertigo, likely BPPV Sinus bradycardia,  Possibly secondary to atenolol Tobacco use disorder Fibromyalgia  PLAN: 1.  Will try Ajovy as migraine preventative 2.  Will have her decrease atenolol back to 50mg  daily 3.  Smoking cessation instruction/counseling given:  counseled patient on the dangers of tobacco use, advised patient to stop smoking, and reviewed strategies to maximize success. 4.  Will refer her to vestibular rehab 5.  She has concerns of having MS.  Due to worsening headache and vertigo, will check MRI of brain with and without contrast. 6.  Stop rizatriptan as ineffective.  At this point, we will hold off on abortive medication 7.  Follow up in 4 months.  25 minutes spent face to face with patient, over 50% spent discussing management.  Metta Clines, DO  CC:  Kelton Pillar, MD

## 2017-08-27 ENCOUNTER — Other Ambulatory Visit: Payer: Self-pay | Admitting: Neurology

## 2017-08-27 ENCOUNTER — Ambulatory Visit
Admission: RE | Admit: 2017-08-27 | Discharge: 2017-08-27 | Disposition: A | Discharge status: Home / Self Care | Payer: Medicaid Other | Source: Ambulatory Visit | Attending: Neurology | Admitting: Neurology

## 2017-08-27 DIAGNOSIS — R51 Headache: Principal | ICD-10-CM

## 2017-08-27 DIAGNOSIS — R519 Headache, unspecified: Secondary | ICD-10-CM

## 2017-08-31 ENCOUNTER — Telehealth: Payer: Self-pay

## 2017-08-31 NOTE — Telephone Encounter (Signed)
-----   Message from Pieter Partridge, DO sent at 08/28/2017  8:15 AM EST ----- MRI Of brain is normal.  There is no evidence of MS or anything else concerning.

## 2017-08-31 NOTE — Telephone Encounter (Signed)
Called Pt, LM on VM advising of MRI results, and to call if any questions.

## 2017-09-15 ENCOUNTER — Telehealth: Payer: Self-pay | Admitting: Neurology

## 2017-09-15 MED ORDER — FREMANEZUMAB-VFRM 225 MG/1.5ML ~~LOC~~ SOSY: 225.0000 mg | PREFILLED_SYRINGE | SUBCUTANEOUS | 11 refills | Status: DC

## 2017-09-15 NOTE — Telephone Encounter (Signed)
Ajovy Rx sent to [facility]

## 2017-09-15 NOTE — Telephone Encounter (Signed)
Pt called and wanted a prescription called in for the Ajovy, seems to be going ok with it and out of samples, [place] CB# [phone]

## 2017-09-16 ENCOUNTER — Encounter: Payer: Self-pay | Admitting: Physical Therapy

## 2017-09-16 ENCOUNTER — Ambulatory Visit: Payer: 59 | Attending: Obstetrics and Gynecology | Admitting: Physical Therapy

## 2017-09-16 DIAGNOSIS — R279 Unspecified lack of coordination: Secondary | ICD-10-CM | POA: Diagnosis present

## 2017-09-16 DIAGNOSIS — M62838 Other muscle spasm: Secondary | ICD-10-CM | POA: Diagnosis not present

## 2017-09-16 DIAGNOSIS — M6281 Muscle weakness (generalized): Secondary | ICD-10-CM | POA: Insufficient documentation

## 2017-09-16 NOTE — Therapy (Signed)
[facility] [address], [place], [place], [address] Phone: [phone]   Fax:  [phone]  Physical Therapy Evaluation  Patient Details  Name: RONIT CRANFIELD MRN: [id number] Date of Birth: Oct 16, 1970 Referring Provider: Thurnell Lose, MD   Encounter Date: 09/16/2017  PT End of Session - 09/16/17 1036    Visit Number  1    Number of Visits  10    Date for PT Re-Evaluation  11/11/17    PT Start Time  1024    PT Stop Time  1100    PT Time Calculation (min)  36 min    Activity Tolerance  Patient tolerated treatment well    Behavior During Therapy  [facility] for tasks assessed/performed       Past Medical History:  Diagnosis Date  . Arthritis   . Bipolar 1 disorder ([place])   . Colon polyp   . Diabetes mellitus without complication ([place])   . GERD (gastroesophageal reflux disease)   . Hypercholesteremia   . Hypertension   . IBS (irritable bowel syndrome)   . Kidney stone   . Lung nodule   . Melanoma ([place])   . Migraine   . PTSD (post-traumatic stress disorder)   . Sleep apnea     Past Surgical History:  Procedure Laterality Date  . DILATION AND CURETTAGE OF UTERUS    . KNEE ARTHROSCOPY    . THROAT SURGERY    . TUBAL LIGATION  12/03/2015    There were no vitals filed for this visit.   Subjective Assessment - 09/16/17 1026    Subjective  Pt has been having abdominal and pelvic pain that feels like being in labor for the last two years.  Pt has been having a lot of gas and bloating after intercourse.  Pt states she went off gabepentin and pain has gotten better other than severe pain that lasts for days after intercourse.    Pertinent History  Lt knee replacement, Rt knee injury, migrains, type 2 diabetes, hyperlipidemia, depression, anxiety, Bipolar disorder, hypertension, IBS, fibromyalgia, lumbar spondylosis, sleep apnea and PTSD    Limitations  Other (comment) intercourse    Patient Stated Goals  get rid of  severe cramping and bloating    Currently in Pain?  Yes after intercourse    Pain Score  10-Worst pain ever    Pain Location  Abdomen    Pain Orientation  Lower    Pain Descriptors / Indicators  Aching    Pain Type  Chronic pain    Pain Radiating Towards  into the groin, upper abdomen bloating    Pain Onset  More than a month ago    Pain Frequency  Intermittent    Aggravating Factors   intercourse, gas and bloating lasts 3-4 days    Pain Relieving Factors  heat and ice, passing the gas    Effect of Pain on Daily Activities  can't get out of bed    Multiple Pain Sites  No         [facility] PT Assessment - 09/16/17 0001      Assessment   Medical Diagnosis  N94.10 (ICD-10-CM) - Unspecified dyspareunia    Referring Provider  Thurnell Lose, MD    Onset Date/Surgical Date  -- 2 years    Prior Therapy  No      Precautions   Precautions  None      Restrictions   Weight Bearing Restrictions  No  Balance Screen   Has the patient fallen in the past 6 months  Yes    How many times?  many times while sleep walking    Has the patient had a decrease in activity level because of a fear of falling?   No    Is the patient reluctant to leave their home because of a fear of falling?   No      Home Film/video editor residence    Living Arrangements  Alone      Prior Function   Level of Independence  Independent    Vocation  On disability    Leisure  walking      Cognition   Overall Cognitive Status  Within Functional Limits for tasks assessed      Posture/Postural Control   Posture/Postural Control  Postural limitations      ROM / Strength   AROM / PROM / Strength  AROM;Strength      Strength   Overall Strength Comments  core weakness 3/5      Flexibility   Soft Tissue Assessment /Muscle Length  yes    Quadriceps  hip flexors limited 10%      Palpation   SI assessment   no obliquity noticed    Palpation comment  abdominal bulging on right side,  low tone of abdomen, tight and tender hip flexors, inguinal ligaments, pubic symphasis      Ambulation/Gait   Gait Pattern  Trunk flexed knee flexed             Objective measurements completed on examination: See above findings.    Pelvic Floor Special Questions - 09/16/17 0001    Prior Pelvic/Prostate Exam  Yes    Are you Pregnant or attempting pregnancy?  No    Prior Pregnancies  Yes    Number of Pregnancies  4 3 births; broke tailbone during one     Number of Vaginal Deliveries  3    Any difficulty with labor and deliveries  No    Episiotomy Performed  Yes 7 stitches    Currently Sexually Active  Yes    Is this Painful  Yes    Marinoff Scale  discomfort that does not affect completion    Urinary Leakage  Yes    How often  daily small amount    Pad use  2-3 maxi pad    Activities that cause leaking  Coughing;Sneezing;Laughing;Lifting    Urinary urgency  No    Fecal incontinence  No IBS    Fluid intake  4 bottles    Caffeine beverages  1 pot/day or day    Skin Integrity  Hemorroids    External Palpation  fascial restrictions at perineal body, tenderness of ischiocavernosis, tender to palpation and tight levator ani and obdurator internus bilateral    Pelvic Floor Internal Exam  pt informed and consent given to perform internal assessment    Exam Type  Vaginal    Strength  fair squeeze, definite lift    Strength # of reps  4    Strength # of seconds  3    Tone  high, takes several seconds to relax after contracting                 PT Short Term Goals - 09/16/17 1553      PT SHORT TERM GOAL #1   Title  independent with initial HEP    Time  4  Period  Weeks    Status  New    Target Date  10/14/17      PT SHORT TERM GOAL #2   Title  able to get up and down from bed and perform functional lift with correctly engaged core and not using valsalva maneuver    Time  4    Period  Weeks    Status  New    Target Date  10/14/17        PT Long Term  Goals - 09/16/17 1555      PT LONG TERM GOAL #1   Title  pt will report 75% less pain and cramping after intercourse    Time  8    Period  Weeks    Status  New    Target Date  11/11/17      PT LONG TERM GOAL #2   Title  pt will be ind with advnaced HEP    Time  8    Period  Weeks    Status  New    Target Date  11/11/17      PT LONG TERM GOAL #3   Title  pt will demonstrate hip extension to 5 degrees past neutral in order to improved upright posture during ambulation    Time  8    Period  Weeks    Status  New    Target Date  11/11/17      PT LONG TERM GOAL #4   Title  Pt will be able to engage core during functional movements such as squatting    Time  8    Period  Weeks    Status  New    Target Date  11/11/17      PT LONG TERM GOAL #5   Title  pt will be able to relax pelvic floor in order to pass stool, urine, or gas without straining or bearing down    Time  8    Period  Weeks    Status  New    Target Date  11/11/17             Plan - 09/16/17 1052    Clinical Impression Statement  Patient came to clinic today due to abdominal gas, bloating, and cramping that lasts for several days after intercourse . Pt has weak core 3/5 MMT and abdominal wall and uses valsava maneuver when getting up and down from supine to sit.  Pt has tenderness at pubic symphasis and inguinal ligaments bilaterally .  She is tender and tight bilateral levator ani, obdurator internus.  Pt has reduced ability to collapse ribcage.  She has tight hip flexors and ambulates with slightly flexed trunk and knee flexion that is also impacted by bilateral knee pain.  Pt has weakness of 3/5 pelvic floor strength with high tone after contracting and has difficulty relaxing muscles once they are contracted.  Pt will benefit from skilled PT to address these impairment.    History and Personal Factors relevant to plan of care:  fibromyalgia, IBS, hystory of abdomial surgery and vaginal deliveries with tearing     Clinical Presentation  Stable    Clinical Presentation due to:  Pt is stable    Clinical Decision Making  Moderate    Rehab Potential  Good    Clinical Impairments Affecting Rehab Potential  bilateral knee pain, fibromyalgia, IBS, hystory of abdomial surgery and vaginal deliveries with tearing    PT Frequency  1x / week  PT Duration  8 weeks    PT Treatment/Interventions  ADLs/Self Care Home Management;Biofeedback;Cryotherapy;Electrical Stimulation;Moist Heat;Traction;Taping;Dry needling;Therapeutic exercise;Therapeutic activities;Stair training;Gait training;Neuromuscular re-education;Balance training;Manual techniques;Patient/family education    PT Next Visit Plan  stress, toilet techniques, urge to void, abdominal massage and core contraction, biofeedback to relax    Consulted and Agree with Plan of Care  Patient       Patient will benefit from skilled therapeutic intervention in order to improve the following deficits and impairments:  Pain, Postural dysfunction, Increased muscle spasms, Impaired tone, Decreased strength  Visit Diagnosis: Other muscle spasm  Muscle weakness (generalized)  Unspecified lack of coordination  G-Codes - Sep 20, 2017 1730    Functional Assessment Tool Used (Outpatient Only)  clinical assessment    Functional Limitation  Other PT primary    Other PT Primary Current Status (D1497)  At least 40 percent but less than 60 percent impaired, limited or restricted    Other PT Primary Goal Status (W2637)  At least 20 percent but less than 40 percent impaired, limited or restricted        Problem List Patient Active Problem List   Diagnosis Date Noted  . Marijuana use, continuous 03/19/2017    Zannie Cove, PT 09-20-2017, 5:36 PM  [facility] [address] [place], [place], [address] Phone: [phone]   Fax:  [phone]  Name: NINA MONDOR MRN: [id number] Date of Birth: [birth year]

## 2017-09-17 ENCOUNTER — Encounter: Payer: Self-pay | Admitting: Physical Therapy

## 2017-09-17 ENCOUNTER — Ambulatory Visit: Payer: 59 | Attending: Neurology | Admitting: Physical Therapy

## 2017-09-17 ENCOUNTER — Telehealth: Payer: Self-pay | Admitting: Neurology

## 2017-09-17 DIAGNOSIS — R2681 Unsteadiness on feet: Secondary | ICD-10-CM | POA: Diagnosis present

## 2017-09-17 DIAGNOSIS — R262 Difficulty in walking, not elsewhere classified: Secondary | ICD-10-CM | POA: Insufficient documentation

## 2017-09-17 DIAGNOSIS — R29818 Other symptoms and signs involving the nervous system: Secondary | ICD-10-CM | POA: Insufficient documentation

## 2017-09-17 DIAGNOSIS — R42 Dizziness and giddiness: Secondary | ICD-10-CM | POA: Diagnosis not present

## 2017-09-17 NOTE — Telephone Encounter (Signed)
Pt left a message saying she was returning your call CB# [phone]

## 2017-09-17 NOTE — Telephone Encounter (Signed)
Pt called and said her Arie Sabina is not covered by insurance and wants to know what Dr Tomi Likens is going to do about it

## 2017-09-17 NOTE — Telephone Encounter (Signed)
Called Stacy Guzman, [place] rep, she said everyone is having issues w/MCR part D right now. She will come by tomorrow and have Dr Tomi Likens sign for samples and we can provide them to Pt until the Pt can get [employer] form signed and sent in for no-cost medication to the Pt. LM for Pt to call me back

## 2017-09-17 NOTE — Telephone Encounter (Signed)
Spoke with Pt, advsd her of conversation with Briscoe Burns, Pt will come by tomorrow to pick up samples and sign paperwork. Pt states was at vestibular rehab today and the therapist suggested she have a scan of her middle ear, notes are to come to [place].

## 2017-09-18 NOTE — Therapy (Addendum)
[facility] [address] [place], [place], [address] Phone: [phone]   Fax:  [phone]  Physical Therapy Evaluation  Patient Details  Name: Stacy Guzman MRN: [id number] Date of Birth: 1971-05-26 Referring Provider: Metta Clines DO   Encounter Date: 09/17/2017  PT End of Session - 09/18/17 1300    Visit Number  2    Number of Visits  19 pelvic 10 + vertigo 9    Date for PT Re-Evaluation  11/11/17 vertigo 11/16/2017    Authorization Type  THN; [employer] medicare    Authorization Time Period  -- vertigo 09/17/17-11/16/17    PT Start Time  1017    PT Stop Time  1102    PT Time Calculation (min)  45 min    Activity Tolerance  Patient tolerated treatment well    Behavior During Therapy  [facility] for tasks assessed/performed       Past Medical History:  Diagnosis Date  . Arthritis   . Bipolar 1 disorder ([place])   . Colon polyp   . Diabetes mellitus without complication ([place])   . GERD (gastroesophageal reflux disease)   . Hypercholesteremia   . Hypertension   . IBS (irritable bowel syndrome)   . Kidney stone   . Lung nodule   . Melanoma ([place])   . Migraine   . PTSD (post-traumatic stress disorder)   . Sleep apnea     Past Surgical History:  Procedure Laterality Date  . DILATION AND CURETTAGE OF UTERUS    . KNEE ARTHROSCOPY    . THROAT SURGERY    . TUBAL LIGATION  12/03/2015    There were no vitals filed for this visit.       [facility] PT Assessment - 09/17/17 1700      Assessment   Medical Diagnosis  vertigo, migraines    Referring Provider  Metta Clines DO    Onset Date/Surgical Date  -- unknown    Prior Therapy  not for vertigo; currently seeing PT for pelvic pain      Precautions   Precautions  Fall      Restrictions   Weight Bearing Restrictions  No      Balance Screen   Has the patient fallen in the past 6 months  Yes    How many times?  5    Has the patient had a decrease in activity level because of  a fear of falling?   No    Is the patient reluctant to leave their home because of a fear of falling?   No      Home Film/video editor residence    Living Arrangements  Alone      Prior Function   Level of Independence  Independent    Vocation  On disability    Leisure  walking      Cognition   Overall Cognitive Status  Within Functional Limits for tasks assessed      Observation/Other Assessments   Focus on Therapeutic Outcomes (FOTO)   not captured      Posture/Postural Control   Posture/Postural Control  Postural limitations    Postural Limitations  Rounded Shoulders;Forward head      Transfers   Transfers  Sit to Stand;Stand to Sit    Sit to Stand  6: Modified independent (Device/Increase time);With upper extremity assist    Stand to Sit  6: Modified independent (Device/Increase time);With upper extremity assist  Ambulation/Gait   Ambulation/Gait  Yes    Ambulation/Gait Assistance  6: Modified independent (Device/Increase time)    Ambulation Distance (Feet)  60 Feet 75    Assistive device  None    Gait Pattern  Decreased arm swing - right;Decreased arm swing - left;Trunk flexed    Ambulation Surface  Indoor          09/17/17 0001  Vestibular Assessment  General Observation states her hearing has gradually gotten worse (worked in Marketing executive with loud noise); reports dizziness has gotten better as they have re-adjusted her BP (dropped dose)  Symptom Behavior  Type of Dizziness Spinning (lightheaded)  Frequency of Dizziness daily (for a few months)  Duration of Dizziness minutes  Aggravating Factors No known aggravating factors  Relieving Factors Closing eyes (holding onto something)  Occulomotor Exam  Occulomotor Alignment Normal  Spontaneous Absent  Gaze-induced Absent  Smooth Pursuits Intact  Saccades Intact  Vestibulo-Occular Reflex  VOR Cancellation Normal  Comment HIT negative bil  Visual Acuity  Static 9  Dynamic 7   Positional Testing  Dix-Hallpike Dix-Hallpike Right;Dix-Hallpike Left  Dix-Hallpike Right  Dix-Hallpike Right Duration 0  Dix-Hallpike Right Symptoms No nystagmus  Dix-Hallpike Left  Dix-Hallpike Left Duration 0  Dix-Hallpike Left Symptoms No nystagmus  Cognition  Cognition Orientation Level Appropriate for developmental age        Objective measurements completed on examination: See above findings.              PT Education - 09/18/17 1248    Education provided  Yes    Education Details  results of peripheral and central vestibular system assessments all negative with ?cervicogenic or migraine-related dizziness (may also be medications)    Person(s) Educated  Patient    Methods  Explanation    Comprehension  Verbalized understanding       PT Short Term Goals - 09/18/17 1816      PT SHORT TERM GOAL #1   Title  Patient will be independent with HEP (Target for all STGs 10/17/16)    Time  4    Period  Weeks    Status  New    Target Date  10/17/17      PT SHORT TERM GOAL #2   Title  Patient will complete SOT and STG/LTG to be set as appropriate.     Time  4    Period  Weeks    Status  New      PT SHORT TERM GOAL #3   Title  Complete FGA +/- Berg for standardized balance assessment and STG/LTGs set as appropriate    Time  4    Period  Weeks    Status  New      PT SHORT TERM GOAL #4   Title  Patient will complete DHi and LTG set as appropriate.    Time  4    Period  Weeks    Status  New        PT Long Term Goals - 09/18/17 1819      PT LONG TERM GOAL #1   Title  Patient will be independent with updated HEP (Target for all LTGs 11/16/17)    Time  8    Period  Weeks    Status  New    Target Date  11/16/17      PT LONG TERM GOAL #2   Title  Further LTGs to be set as additional assessments completed (see STGs)  Plan - 09/18/17 1305    Clinical Impression Statement  Patient referred to PT for evaluation of vertigo. Patient has >1  year history of spontaneous episodes of short duration (minutes) where she feels things are spinning. All assessments for peripheral vestibular dysfunction were negative. Pt endorses migraines and neck pain with possible contribution to her vertigo. Will need at least one more appointment for further assessment (lack of time to complete today) to determine if PT can be of benefit.     History and Personal Factors relevant to plan of care:  PMH- migraines, fibromyalgia, PTSD, TBI    Personal factors-emotional/coping factors, support system    Clinical Presentation  Stable    Clinical Presentation due to:  symptoms present for >1 year    Clinical Decision Making  Low    Rehab Potential  Good    PT Frequency  1x / week    PT Duration  8 weeks    PT Treatment/Interventions  ADLs/Self Care Home Management;Biofeedback;Moist Heat;Traction;Ultrasound;DME Instruction;Gait training;Neuromuscular re-education;Balance training;Therapeutic exercise;Therapeutic activities;Functional mobility training;Patient/family education;Manual techniques;Passive range of motion;Dry needling;Vestibular;Visual/perceptual remediation/compensation    PT Next Visit Plan  motion sensitivity testing; ?SOT; ?cervicogenic assessment/treatment    Consulted and Agree with Plan of Care  Patient       Patient will benefit from skilled therapeutic intervention in order to improve the following deficits and impairments:  Decreased activity tolerance, Decreased balance, Decreased mobility, Decreased knowledge of precautions, Decreased knowledge of use of DME, Dizziness, Difficulty walking, Postural dysfunction  Visit Diagnosis: Dizziness and giddiness - Plan: PT plan of care cert/re-cert  Difficulty in walking, not elsewhere classified - Plan: PT plan of care cert/re-cert  Other symptoms and signs involving the nervous system - Plan: PT plan of care cert/re-cert  Unsteadiness on feet - Plan: PT plan of care  cert/re-cert     Problem List Patient Active Problem List   Diagnosis Date Noted  . Marijuana use, continuous 03/19/2017    Rexanne Mano, PT 09/18/2017, 6:43 PM  [facility] [address] [place], [place], [address] Phone: [phone]   Fax:  [phone]  Name: Stacy Guzman MRN: [id number] Date of Birth: 05-20-1971

## 2017-09-22 ENCOUNTER — Ambulatory Visit: Payer: 59 | Admitting: Physical Therapy

## 2017-09-28 ENCOUNTER — Encounter: Payer: Self-pay | Admitting: Physical Therapy

## 2017-09-28 ENCOUNTER — Ambulatory Visit: Payer: 59 | Admitting: Physical Therapy

## 2017-09-28 DIAGNOSIS — M62838 Other muscle spasm: Secondary | ICD-10-CM | POA: Diagnosis not present

## 2017-09-28 DIAGNOSIS — M6281 Muscle weakness (generalized): Secondary | ICD-10-CM

## 2017-09-28 DIAGNOSIS — R279 Unspecified lack of coordination: Secondary | ICD-10-CM

## 2017-09-28 NOTE — Patient Instructions (Addendum)
Hook-Lying    Lie with hips and knees bent. Allow body's muscles to relax. Place hands on belly. Inhale slowly and deeply for __3_ seconds, so hands move up. Then take _3__ seconds to exhale. Repeat __5_ times. Do __3_ times a day.   Copyright  VHI. All rights reserved.    Sitting    Sit comfortably. Allow body's muscles to relax. Place hands on belly. Inhale slowly and deeply for _3__ seconds, so hands move out. Then take _3__ seconds to exhale. Repeat _5__ times. Do _3__ times a day.  Copyright  VHI. All rights reserved.    App called Head space for meditation.   [facility] [address] [place], [place] [address] Phone # [phone] Fax [phone]

## 2017-09-28 NOTE — Therapy (Signed)
[facility] [address] [place], [place], [address] Phone: [phone]   Fax:  [phone]  Physical Therapy Treatment  Patient Details  Name: Stacy Guzman MRN: [id number] Date of Birth: 01-22-1971 Referring Provider: Metta Clines DO   Encounter Date: 09/28/2017  PT End of Session - 09/28/17 1218    Visit Number  3    Date for PT Re-Evaluation  11/11/17 PT    Authorization Type  THN; [employer] medicare    PT Start Time  1130    PT Stop Time  1219    PT Time Calculation (min)  49 min    Activity Tolerance  Patient tolerated treatment well;Patient limited by pain    Behavior During Therapy  [facility] for tasks assessed/performed       Past Medical History:  Diagnosis Date  . Arthritis   . Bipolar 1 disorder ([place])   . Colon polyp   . Diabetes mellitus without complication ([place])   . GERD (gastroesophageal reflux disease)   . Hypercholesteremia   . Hypertension   . IBS (irritable bowel syndrome)   . Kidney stone   . Lung nodule   . Melanoma ([place])   . Migraine   . PTSD (post-traumatic stress disorder)   . Sleep apnea     Past Surgical History:  Procedure Laterality Date  . DILATION AND CURETTAGE OF UTERUS    . KNEE ARTHROSCOPY    . THROAT SURGERY    . TUBAL LIGATION  12/03/2015    There were no vitals filed for this visit.  Subjective Assessment - 09/28/17 1140    Subjective  I am in vertigo therapy starting this week.  I will be having meniscus surgery on right knee on 10/15/2017.  After last visit, I feel like my cyst out of the vaginal because a blood clot  came out of my vagina.      Pertinent History  Lt knee replacement, Rt knee injury, migrains, type 2 diabetes, hyperlipidemia, depression, anxiety, Bipolar disorder, hypertension, IBS, fibromyalgia, lumbar spondylosis, sleep apnea and PTSD    Patient Stated Goals  get rid of severe cramping and bloating; stop feeling dizzy    Currently in Pain?  Yes    Pain  Score  4     Pain Location  Abdomen    Pain Orientation  Lower    Pain Descriptors / Indicators  Tender    Pain Type  Chronic pain    Pain Onset  More than a month ago    Pain Frequency  Intermittent    Aggravating Factors   intercourse, gas and bloating lasts 3-4 days    Pain Relieving Factors  heat and ice, passing the gas    Effect of Pain on Daily Activities  can't get out of bed    Multiple Pain Sites  No                              PT Education - 09/28/17 1218    Education provided  Yes    Education Details  diaphgramatic breathing; meditation app to work on .     Person(s) Educated  Patient    Methods  Explanation;Demonstration;Verbal cues;Handout    Comprehension  Verbalized understanding;Returned demonstration       PT Short Term Goals - 09/28/17 1224      PT SHORT TERM GOAL #5   Title  able to get up and  down from bed and perfrom functional lift with correctly engaged corew and not using valsalva maneuver    Time  4    Period  Weeks    Status  New    Target Date  10/14/17        PT Long Term Goals - 09/28/17 1225      Additional Long Term Goals   Additional Long Term Goals  Yes      PT LONG TERM GOAL #6   Title  Patient will report 75% less pain and cramping after intercourse    Time  8    Period  Weeks    Status  New    Target Date  11/11/17            Plan - 09/28/17 1219    Clinical Impression Statement  After therapy patient abdominal pain decreased to 2/10. Patient had decreased mobility of the rib cage and diaphgram.  Patient has low tone of the abdomen. Patient will be having arthroscopic surgery to right knee on 10/15/2017.  Patient is having therapy for vetigo at the neuro rehab.  Patient will benefit from skilled therapy to improve funciton while reducing pain.     Rehab Potential  Good    Clinical Impairments Affecting Rehab Potential  bilateral knee pain, fibromyalgia, IBS, hystory of abdomial surgery and vaginal  deliveries with tearing    PT Frequency  1x / week    PT Duration  8 weeks    PT Treatment/Interventions  ADLs/Self Care Home Management;Biofeedback;Moist Heat;Traction;Ultrasound;DME Instruction;Gait training;Neuromuscular re-education;Balance training;Therapeutic exercise;Therapeutic activities;Functional mobility training;Patient/family education;Manual techniques;Passive range of motion;Dry needling;Vestibular;Visual/perceptual remediation/compensation    PT Next Visit Plan  toilet technique; urge to void; abdominal massage; core bracing    PT Home Exercise Plan  progress as needed    Consulted and Agree with Plan of Care  Patient       Patient will benefit from skilled therapeutic intervention in order to improve the following deficits and impairments:  Decreased activity tolerance, Decreased balance, Decreased mobility, Decreased knowledge of precautions, Decreased knowledge of use of DME, Dizziness, Difficulty walking, Postural dysfunction  Visit Diagnosis: Other muscle spasm  Muscle weakness (generalized)  Unspecified lack of coordination     Problem List Patient Active Problem List   Diagnosis Date Noted  . Marijuana use, continuous 03/19/2017    Earlie Counts, PT 09/28/17 12:27 PM   [facility] [address] [place] [place], [place], [address] Phone: [phone]   Fax:  [phone]  Name: Stacy Guzman MRN: [id number] Date of Birth: [birth year]

## 2017-09-30 ENCOUNTER — Encounter: Payer: Self-pay | Admitting: Physical Therapy

## 2017-09-30 ENCOUNTER — Ambulatory Visit: Payer: 59 | Admitting: Physical Therapy

## 2017-09-30 DIAGNOSIS — R262 Difficulty in walking, not elsewhere classified: Secondary | ICD-10-CM

## 2017-09-30 DIAGNOSIS — R42 Dizziness and giddiness: Secondary | ICD-10-CM | POA: Diagnosis not present

## 2017-09-30 NOTE — Patient Instructions (Signed)
Gaze Stabilization: Tip Card  1.Target must remain in focus, not blurry, and appear stationary while head is in motion. 2.Perform exercises with small head movements (45 to either side of midline). 3.Increase speed of head motion so long as target is in focus. 4.If you wear eyeglasses, be sure you can see target through lens (therapist will give specific instructions for bifocal / progressive lenses). 5.These exercises will provoke dizziness or nausea. Work through these symptoms. If too dizzy, slow head movement slightly. Rest between each exercise. 6.Exercises demand concentration; avoid distractions.  Copyright  VHI. All rights reserved.     Special Instructions: Exercises will bring on mild to moderate symptoms of dizziness that resolve within 30 minutes of completing exercises. Keep symptoms less than 7 out of 10.  If symptoms are lasting longer than 30 minutes, modify your exercises by:  >decreasing the # of times you complete each activity >ensuring your symptoms return to baseline before moving onto the next exercise >dividing up exercises so you do not do them all in one session, but multiple short sessions throughout the day >doing them once a day until symptoms improve   Gaze Stabilization: Sitting    Keeping eyes on target on wall 2-3 feet away, and move head side to side for _30-60___ seconds. Repeat for total of 3 repetitions. Do __3__ sessions per day.  Copyright  VHI. All rights reserved.

## 2017-09-30 NOTE — Therapy (Signed)
[facility] [address] [place], [place], [address] Phone: [phone]   Fax:  [phone]  Physical Therapy Treatment  Patient Details  Name: Stacy Guzman MRN: [id number] Date of Birth: 1971-09-17 Referring Provider: Metta Clines DO   Encounter Date: 09/30/2017  PT End of Session - 09/30/17 1940    Visit Number  4    Number of Visits  19 pelvic 10 + vertigo 9    Date for PT Re-Evaluation  11/11/17 vertigo 11/16/2017    Authorization Type  THN; [employer] medicare    Authorization Time Period  -- vertigo 09/17/17-11/16/17    PT Start Time  1017    PT Stop Time  1100    PT Time Calculation (min)  43 min    Activity Tolerance  Other (comment) limited by dizziness at times    Behavior During Therapy  [facility] for tasks assessed/performed       Past Medical History:  Diagnosis Date  . Arthritis   . Bipolar 1 disorder ([place])   . Colon polyp   . Diabetes mellitus without complication ([place])   . GERD (gastroesophageal reflux disease)   . Hypercholesteremia   . Hypertension   . IBS (irritable bowel syndrome)   . Kidney stone   . Lung nodule   . Melanoma ([place])   . Migraine   . PTSD (post-traumatic stress disorder)   . Sleep apnea     Past Surgical History:  Procedure Laterality Date  . DILATION AND CURETTAGE OF UTERUS    . KNEE ARTHROSCOPY    . THROAT SURGERY    . TUBAL LIGATION  12/03/2015    There were no vitals filed for this visit.  Subjective Assessment - 09/30/17 1016    Subjective  I had one day when I was VERY dizzy, then next day about 1/2 as much, then today not as much. 4/10 dizziness     Pertinent History  Lt knee replacement, Rt knee injury, migrains, type 2 diabetes, hyperlipidemia, depression, anxiety, Bipolar disorder, hypertension, IBS, fibromyalgia, lumbar spondylosis, sleep apnea and PTSD    Patient Stated Goals  get rid of severe cramping and bloating; stop feeling dizzy    Currently in Pain?  Yes     Pain Score  6     Pain Location  Neck    Pain Orientation  Left;Lower    Pain Descriptors / Indicators  Tightness    Pain Type  Chronic pain    Pain Onset  More than a month ago    Pain Frequency  Intermittent             Vestibular Assessment - 09/30/17 1923      Positional Sensitivities   Sit to Supine  Severe dizziness    Supine to Sitting  Moderate dizziness    Head Turning x 5  Severe dizziness    Positional Sensitivities Comments  full assessment not completed due to time constraints              [facility] Adult PT Treatment/Exercise - 09/30/17 0001      Ambulation/Gait   Ambulation/Gait  Yes    Ambulation/Gait Assistance  6: Modified independent (Device/Increase time)    Ambulation Distance (Feet)  100 Feet 50    Assistive device  None    Gait Pattern  Decreased arm swing - right;Decreased arm swing - left;Trunk flexed    Ambulation Surface  Indoor      Manual Therapy   Manual  Therapy  Soft tissue mobilization;Manual Traction    Manual therapy comments  supine assessed cervical PROM tolerance with Lt/rt rotation ~60-70 degrees; lateral flexion to rt ~20 with incr neck and rt arm pain; lat flexion lt ~40 with stretching on right    Soft tissue mobilization  no palpable trigger points or overactive muscles palpable in supine (including SCM, scalenes, cervical paraspinals) however pt reported severe tenderness along all muscles during palpation (inlcuding trapezius)    Manual Traction  slow progression from ~10-20 lbs with pt reporting relief in rt neck pain, arm pain with symptoms returning as traction released (pt with known cervical nerve compression from MRI)      Vestibular Treatment/Exercise - 09/30/17 0001      Vestibular Treatment/Exercise   Vestibular Treatment Provided  Gaze    Gaze Exercises  X1 Viewing Horizontal      X1 Viewing Horizontal   Foot Position  seated    Time  -- 15    Reps  1    Comments  object began to move and symptoms incr 6 to  7/10            PT Education - 09/30/17 1937    Education provided  Yes    Education Details  does not appear to be cervicogenic dizziness; encouraged supine cervical AROM/stretching to point of mild stretch; initiated HEP with VORx1 as this exercise quickly reproduced her symptoms    Person(s) Educated  Patient    Methods  Explanation;Demonstration;Handout    Comprehension  Verbalized understanding;Returned demonstration;Need further instruction       PT Short Term Goals - 09/30/17 2006      PT SHORT TERM GOAL #1   Title  Patient will be independent with HEP (Target for all STGs 10/17/16)    Time  4    Period  Weeks    Status  New      PT SHORT TERM GOAL #2   Title  Patient will complete SOT and STG/LTG to be set as appropriate.     Time  4    Period  Weeks    Status  New      PT SHORT TERM GOAL #3   Title  Complete FGA +/- Berg for standardized balance assessment and STG/LTGs set as appropriate    Time  4    Period  Weeks    Status  New      PT SHORT TERM GOAL #4   Title  Patient will complete DHi and LTG set as appropriate.    Baseline  09/30/17  42/100    Time  4    Period  Weeks    Status  Achieved      PT SHORT TERM GOAL #5   Title  DHI score will decrease to <=30 to demonstrate progress and move from moderate handicap category to mild.    Baseline  09/30/17  42%    Time  4    Period  Weeks    Status  New               Plan - 09/30/17 1942    Clinical Impression Statement  Session focused on cervical assessment for possible cervicogenic component to dizziness. No cervical spine rotation or muscle spasms present causing a misalignment of her cervical spine. Tenderness throughtout her cervical musculature more consistent with her diagnosed fibromyalgia. Unable to complete SOT this date as Balancemaster in use with another patient. [place] completed with pt scoring 42% or  moderate handicap related to her dizziness. Overall testing is pointing towards motion  sensitivity as primary cause for dizziness. Unable to complete full motion sensitivity testing due to time constraints. Will plan to furher assess next visit and start patient on habituation activities.     Rehab Potential  Good    PT Frequency  1x / week    PT Duration  8 weeks    PT Treatment/Interventions  ADLs/Self Care Home Management;Biofeedback;Moist Heat;Traction;Ultrasound;DME Instruction;Gait training;Neuromuscular re-education;Balance training;Therapeutic exercise;Therapeutic activities;Functional mobility training;Patient/family education;Manual techniques;Passive range of motion;Dry needling;Vestibular;Visual/perceptual remediation/compensation    PT Next Visit Plan  check tolerance/progress with VORx1; motion sensitivity testing; give habituation exercises; ?SOT; ?FGA    Consulted and Agree with Plan of Care  Patient       Patient will benefit from skilled therapeutic intervention in order to improve the following deficits and impairments:  Decreased activity tolerance, Decreased balance, Decreased mobility, Decreased knowledge of precautions, Decreased knowledge of use of DME, Dizziness, Difficulty walking, Postural dysfunction  Visit Diagnosis: Dizziness and giddiness  Difficulty in walking, not elsewhere classified     Problem List Patient Active Problem List   Diagnosis Date Noted  . Marijuana use, continuous 03/19/2017    Rexanne Mano, PT 09/30/2017, 8:12 PM  [facility] [address] [place], [place], [address] Phone: [phone]   Fax:  [phone]  Name: Stacy Guzman MRN: [id number] Date of Birth: [birth year]

## 2017-10-09 ENCOUNTER — Encounter: Payer: Self-pay | Admitting: Physical Therapy

## 2017-10-09 ENCOUNTER — Ambulatory Visit: Payer: 59 | Admitting: Physical Therapy

## 2017-10-09 DIAGNOSIS — R262 Difficulty in walking, not elsewhere classified: Secondary | ICD-10-CM

## 2017-10-09 DIAGNOSIS — R42 Dizziness and giddiness: Secondary | ICD-10-CM | POA: Diagnosis not present

## 2017-10-09 DIAGNOSIS — R2681 Unsteadiness on feet: Secondary | ICD-10-CM

## 2017-10-09 NOTE — Patient Instructions (Signed)
Forward / Backward Progression With 180 (Half) Turns    Stand with your back to a chair and a wall in front of you. Do a slow half turn in place, toward left for 5 turns. Keep eyes open and focus on an object to allow dizziness to return to baseline. Then repeat with turns to your right x 5 reps.  Do __3__ sessions per day.   Copyright  VHI. All rights reserved.

## 2017-10-12 NOTE — Therapy (Signed)
[facility] [address] [place], [place], [address] Phone: [phone]   Fax:  [phone]  Physical Therapy Treatment  Patient Details  Name: Stacy Guzman MRN: [id number] Date of Birth: 1971-03-10 Referring Provider: Metta Clines DO   Encounter Date: 10/09/2017    Past Medical History:  Diagnosis Date  . Arthritis   . Bipolar 1 disorder ([place])   . Colon polyp   . Diabetes mellitus without complication ([place])   . GERD (gastroesophageal reflux disease)   . Hypercholesteremia   . Hypertension   . IBS (irritable bowel syndrome)   . Kidney stone   . Lung nodule   . Melanoma ([place])   . Migraine   . PTSD (post-traumatic stress disorder)   . Sleep apnea     Past Surgical History:  Procedure Laterality Date  . DILATION AND CURETTAGE OF UTERUS    . KNEE ARTHROSCOPY    . THROAT SURGERY    . TUBAL LIGATION  12/03/2015    There were no vitals filed for this visit.  Subjective Assessment - 10/12/17 1150    Subjective  Very stressed over the holiday (neighbor died in her arms, 40 yo daughter had to have stent put in her kidney, major abdominal pain and knows it is because of stress).     Pertinent History  Lt knee replacement, Rt knee injury, migrains, type 2 diabetes, hyperlipidemia, depression, anxiety, Bipolar disorder, hypertension, IBS, fibromyalgia, lumbar spondylosis, sleep apnea and PTSD    Patient Stated Goals  get rid of severe cramping and bloating; stop feeling dizzy    Pain Onset  More than a month ago             Vestibular Assessment - 10/12/17 0001      Positional Sensitivities   Sit to Supine  No dizziness    Supine to Left Side  No dizziness    Supine to Right Side  No dizziness    Supine to Sitting  Lightheadedness    Right Hallpike  Moderate dizziness    Up from Right Hallpike  Moderate dizziness    Up from Left Hallpike  Moderate dizziness    Nose to Right Knee  No dizziness    Right Knee to Sitting  Moderate dizziness    Nose to Left Knee  Mild dizziness    Left Knee to Sitting  Moderate dizziness    Head Turning x 5  Mild dizziness    Head Nodding x 5  Moderate dizziness    Pivot Right in Standing  Moderate dizziness    Pivot Left in Standing  Moderate dizziness    Rolling Right  Lightheadedness    Rolling Left  Lightheadedness    Positional Sensitivities Comments  into Lt hallpike 0; rolling from lt or rt side to supine 1/5              [facility] Adult PT Treatment/Exercise - 10/12/17 0001      Ambulation/Gait   Ambulation/Gait  Yes    Ambulation/Gait Assistance  6: Modified independent (Device/Increase time)    Ambulation Distance (Feet)  100 Feet 50    Assistive device  None    Gait Pattern  Decreased arm swing - right;Decreased arm swing - left;Trunk flexed;Decreased stride length;Decreased weight shift to left;Antalgic    Ambulation Surface  Indoor      Vestibular Treatment/Exercise - 10/12/17 0001      Vestibular Treatment/Exercise   Vestibular Treatment Provided  Habituation  Habituation Exercises  180 degree Turns      180 degree Turns   Number of Reps   5 all left; then 5 right    Symptom Description   moderate dizziness resolved in 5 seconds            PT Education - 10/12/17 1153    Education provided  Yes    Education Details  see additions to HEP    Person(s) Educated  Patient    Methods  Explanation;Demonstration;Handout    Comprehension  Verbalized understanding;Returned demonstration;Need further instruction       PT Short Term Goals - 09/30/17 2006      PT SHORT TERM GOAL #1   Title  Patient will be independent with HEP (Target for all STGs 10/17/16)    Time  4    Period  Weeks    Status  New      PT SHORT TERM GOAL #2   Title  Patient will complete SOT and STG/LTG to be set as appropriate.     Time  4    Period  Weeks    Status  New      PT SHORT TERM GOAL #3   Title  Complete FGA +/- Berg for  standardized balance assessment and STG/LTGs set as appropriate    Time  4    Period  Weeks    Status  New      PT SHORT TERM GOAL #4   Title  Patient will complete DHi and LTG set as appropriate.    Baseline  09/30/17  42/100    Time  4    Period  Weeks    Status  Achieved      PT SHORT TERM GOAL #5   Title  DHI score will decrease to <=30 to demonstrate progress and move from moderate handicap category to mild.    Baseline  09/30/17  42%    Time  4    Period  Weeks    Status  New        PT Long Term Goals - 09/28/17 1225      Additional Long Term Goals   Additional Long Term Goals  Yes      PT LONG TERM GOAL #6   Title  Patient will report 75% less pain and cramping after intercourse    Time  8    Period  Weeks    Status  New    Target Date  11/11/17            Plan - 10/12/17 1155    Clinical Impression Statement  Session focused on motion sensitivity quotient assessment and developing HEP with habituation exercises. Patient remains motivated to reduce her symptoms and appears can benefit from continued PT.     Rehab Potential  Good    PT Frequency  1x / week    PT Duration  8 weeks    PT Treatment/Interventions  ADLs/Self Care Home Management;Biofeedback;Moist Heat;Traction;Ultrasound;DME Instruction;Gait training;Neuromuscular re-education;Balance training;Therapeutic exercise;Therapeutic activities;Functional mobility training;Patient/family education;Manual techniques;Passive range of motion;Dry needling;Vestibular;Visual/perceptual remediation/compensation    PT Next Visit Plan  check tolerance/progress with VORx1; check 180 turns for HEP; do FGA and corner balance ex's    Consulted and Agree with Plan of Care  Patient       Patient will benefit from skilled therapeutic intervention in order to improve the following deficits and impairments:  Decreased activity tolerance, Decreased balance, Decreased mobility, Decreased knowledge of precautions, Decreased  knowledge  of use of DME, Dizziness, Difficulty walking, Postural dysfunction  Visit Diagnosis: Dizziness and giddiness  Difficulty in walking, not elsewhere classified  Unsteadiness on feet     Problem List Patient Active Problem List   Diagnosis Date Noted  . Marijuana use, continuous 03/19/2017    Rexanne Mano, PT 10/12/2017, 12:01 PM  [facility] [address] [place], [place], [address] Phone: [phone]   Fax:  [phone]  Name: Stacy Guzman MRN: [id number] Date of Birth: [birth year]

## 2017-10-14 ENCOUNTER — Encounter: Payer: Self-pay | Admitting: Physical Therapy

## 2017-10-14 ENCOUNTER — Ambulatory Visit: Payer: 59 | Attending: Obstetrics and Gynecology | Admitting: Physical Therapy

## 2017-10-14 DIAGNOSIS — M62838 Other muscle spasm: Secondary | ICD-10-CM | POA: Diagnosis present

## 2017-10-14 DIAGNOSIS — R279 Unspecified lack of coordination: Secondary | ICD-10-CM | POA: Diagnosis present

## 2017-10-14 DIAGNOSIS — M6281 Muscle weakness (generalized): Secondary | ICD-10-CM | POA: Insufficient documentation

## 2017-10-14 NOTE — Patient Instructions (Addendum)
Toileting Techniques for Bowel Movements (Defecation) Using your belly (abdomen) and pelvic floor muscles to have a bowel movement is usually instinctive.  Sometimes people can have problems with these muscles and have to relearn proper defecation (emptying) techniques.  If you have weakness in your muscles, organs that are falling out, decreased sensation in your pelvis, or ignore your urge to go, you may find yourself straining to have a bowel movement.  You are straining if you are: . holding your breath or taking in a huge gulp of air and holding it  . keeping your lips and jaw tensed and closed tightly . turning red in the face because of excessive pushing or forcing . developing or worsening your  hemorrhoids . getting faint while pushing . not emptying completely and have to defecate many times a day  If you are straining, you are actually making it harder for yourself to have a bowel movement.  Many people find they are pulling up with the pelvic floor muscles and closing off instead of opening the anus. Due to lack pelvic floor relaxation and coordination the abdominal muscles, one has to work harder to push the feces out.  Many people have never been taught how to defecate efficiently and effectively.  Notice what happens to your body when you are having a bowel movement.  While you are sitting on the toilet pay attention to the following areas: . Jaw and mouth position . Angle of your hips   . Whether your feet touch the ground or not . Arm placement  . Spine position . Waist . Belly tension . Anus (opening of the anal canal)  An Evacuation/Defecation Plan   Here are the 4 basic points:  1. Lean forward enough for your elbows to rest on your knees 2. Support your feet on the floor or use a low stool if your feet don't touch the floor  3. Push out your belly as if you have swallowed a beach ball-you should feel a widening of your waist 4. Open and relax your pelvic floor muscles,  rather than tightening around the anus      The following conditions my require modifications to your toileting posture:  . If you have had surgery in the past that limits your back, hip, pelvic, knee or ankle flexibility . Constipation   Your healthcare practitioner may make the following additional suggestions and adjustments:  1) Sit on the toilet  a) Make sure your feet are supported. b) Notice your hip angle and spine position-most people find it effective to lean forward or raise their knees, which can help the muscles around the anus to relax  c) When you lean forward, place your forearms on your thighs for support  2) Relax suggestions a) Breath deeply in through your nose and out slowly through your mouth as if you are smelling the flowers and blowing out the candles. b) To become aware of how to relax your muscles, contracting and releasing muscles can be helpful.  Pull your pelvic floor muscles in tightly by using the image of holding back gas, or closing around the anus (visualize making a circle smaller) and lifting the anus up and in.  Then release the muscles and your anus should drop down and feel open. Repeat 5 times ending with the feeling of relaxation. c) Keep your pelvic floor muscles relaxed; let your belly bulge out. d) The digestive tract starts at the mouth and ends at the anal opening, so be   sure to relax both ends of the tube.  Place your tongue on the roof of your mouth with your teeth separated.  This helps relax your mouth and will help to relax the anus at the same time.  3) Empty (defecation) a) Keep your pelvic floor and sphincter relaxed, then bulge your anal muscles.  Make the anal opening wide.  b) Stick your belly out as if you have swallowed a beach ball. c) Make your belly wall hard using your belly muscles while continuing to breathe. Doing this makes it easier to open your anus. d) Breath out and give a grunt (or try using other sounds such as  ahhhh, shhhhh, ohhhh or grrrrrrr).  4) Finish a) As you finish your bowel movement, pull the pelvic floor muscles up and in.  This will leave your anus in the proper place rather than remaining pushed out and down. If you leave your anus pushed out and down, it will start to feel as though that is normal and give you incorrect signals about needing to have a bowel movement.    Earlie Counts, PT [facility] [address] [place], [place] [address] Certain foods and liquids will decrease the pH making the urine more acidic.  Urinary urgency increases when the urine has a low pH.  Most common irritants: alcohol, carbonated beverages and caffinated beverages.  Foods to avoid: apple juice, apples, ascorbic acid, canteloupes, chili, citrus fruits, coffee, cranberries, grapes, guava, peaches, pepper, pineapple, plums, strawberries, tea, tomatoes, and vinegar.  Drinking plenty of water may help to increase the pH and dilute out any of the effects of specific irritants.  Foods that are NOT irritating to the bladder include: Pears, papayas, sun-brewed teas, watermelons, non-citrus herbal teas, apricots, kava and low-acid instant drinks (Postum)  Relaxation Exercises with the Urge to Void   When you experience an urge to void:  FIRST  Stop and stand very still    Sit down if you can    Don't move    You need to stay very still to maintain control  SECOND Squeeze your pelvic floor muscles 5 times, like a quick flick, to keep from leaking  THIRD Relax  Take a deep breath and then let it out  Try to make the urge go away by using relaxation and visualization techniques  FINALLY When you feel the urge go away somewhat, walk normally to the bathroom.   If the urge gets suddenly stronger on the way, you may stop again and relax to regain control.

## 2017-10-14 NOTE — Therapy (Signed)
[facility] [address] [place], [place], [address] Phone: [phone]   Fax:  [phone]  Physical Therapy Treatment  Patient Details  Name: Stacy Guzman MRN: [id number] Date of Birth: 1971-05-19 Referring Provider: Metta Clines DO   Encounter Date: 10/14/2017  PT End of Session - 10/14/17 1046    Visit Number  6    Date for PT Re-Evaluation  11/11/17 pelvic floor PT    Authorization Type  [employer]; [employer] medicare    PT Start Time  [phone]    PT Stop Time  1048    PT Time Calculation (min)  56 min    Activity Tolerance  Patient tolerated treatment well    Behavior During Therapy  [facility] for tasks assessed/performed       Past Medical History:  Diagnosis Date  . Arthritis   . Bipolar 1 disorder ([place])   . Colon polyp   . Diabetes mellitus without complication ([place])   . GERD (gastroesophageal reflux disease)   . Hypercholesteremia   . Hypertension   . IBS (irritable bowel syndrome)   . Kidney stone   . Lung nodule   . Melanoma ([place])   . Migraine   . PTSD (post-traumatic stress disorder)   . Sleep apnea     Past Surgical History:  Procedure Laterality Date  . DILATION AND CURETTAGE OF UTERUS    . KNEE ARTHROSCOPY    . THROAT SURGERY    . TUBAL LIGATION  12/03/2015    There were no vitals filed for this visit.  Subjective Assessment - 10/14/17 0951    Subjective  I will be having knee surgery on 10/16/2017. My stomach is feeling better with the deep breathing.  Stress wil increase my abdominal pain. I have to watch certain foods. My abdominal pain is 50% better. The abdominal massage has helped. I fell on 10/02/2017 and 10/08/2017.  Patient is in vertigo therapy. Patient reports her back has increased pain since last treatment. I have pain with breathing.     Pertinent History  Lt knee replacement, Rt knee injury, migrains, type 2 diabetes, hyperlipidemia, depression, anxiety, Bipolar disorder, hypertension, IBS,  fibromyalgia, lumbar spondylosis, sleep apnea and PTSD    Limitations  Other (comment) intercourse    Patient Stated Goals  get rid of severe cramping and bloating; stop feeling dizzy    Currently in Pain?  Yes    Pain Score  7     Pain Location  Back    Pain Orientation  Lower    Pain Descriptors / Indicators  Aching    Pain Type  Chronic pain    Pain Onset  More than a month ago    Pain Frequency  Intermittent    Aggravating Factors   abdominal massage    Pain Relieving Factors  heat and ice, passing the gas    Multiple Pain Sites  Yes    Pain Score  7    Pain Location  Back    Pain Orientation  Mid    Pain Descriptors / Indicators  Sharp;Stabbing    Pain Type  Acute pain    Pain Onset  1 to 4 weeks ago    Pain Frequency  Intermittent    Aggravating Factors   breath    Pain Relieving Factors  nothing                   Pelvic Floor Special Questions - 10/14/17 0001  Pelvic Floor Internal Exam  pt informed and consent given to perform internal assessment    Exam Type  Vaginal    Palpation  tenderness located in bil. obturator internist and levator ani    Strength  fair squeeze, definite lift        [facility] Adult PT Treatment/Exercise - 10/14/17 0001      Self-Care   Self-Care  Other Self-Care Comments    Other Self-Care Comments   discuss diet and how it affects her IBS, urge to void and how certain foods affect the bladder      Modalities   Modalities  Moist Heat      Moist Heat Therapy   Number Minutes Moist Heat  25 Minutes    Moist Heat Location  Lumbar Spine while having soft tissue work treatment      Manual Therapy   Manual Therapy  Internal Pelvic Floor    Internal Pelvic Floor  bil. urethra sphincter, bil. obturator internist, bil. ishiococcygeus, coccygeus, and piriformis with therapist using hip movements             PT Education - 10/14/17 1042    Education provided  Yes    Education Details  urge to void, toileting technique,  bladder irritants    Person(s) Educated  Patient    Methods  Explanation;Demonstration;Verbal cues;Handout    Comprehension  Verbalized understanding;Returned demonstration       PT Short Term Goals - 10/14/17 1052      PT SHORT TERM GOAL #1   Title  Patient will be independent with HEP (Target for all STGs 10/17/16)    Time  4    Period  Weeks    Status  On-going        PT Long Term Goals - 10/14/17 1053      PT LONG TERM GOAL #1   Title  Patient will be independent with updated HEP (Target for all LTGs 11/16/17)    Time  8    Period  Weeks    Status  New      PT LONG TERM GOAL #2   Title  Further LTGs to be set as additional assessments completed (see STGs)    Time  8    Period  Weeks    Status  New      PT LONG TERM GOAL #3   Title  pt will demonstrate hip extension to 5 degrees past neutral in order to improved upright posture during ambulation    Time  8    Period  Weeks    Status  New      PT LONG TERM GOAL #4   Title  Pt will be able to engage core during functional movements such as squatting    Time  8    Period  Weeks    Status  On-going      PT LONG TERM GOAL #5   Title  pt will be able to relax pelvic floor in order to pass stool, urine, or gas without straining or bearing down    Time  8      PT LONG TERM GOAL #6   Title  Patient will report 75% less pain and cramping after intercourse    Time  8    Period  Weeks    Status  On-going            Plan - 10/14/17 1047    Clinical Impression Statement  Paitent reports pain in  abdomen has decreased 50% from last visit.  Patient reports her back pain has increased.  Patient will be having knee sugery on Friday. Patient is also recieving Vertigo therapy. Patient had trigger points in the pelvic floor muscles and responded well to the soft tissue work.  Pelvic floor strength is 3/5.  Patient is drinking a pot of coffee per day and can irritate the bladder and increase urinary leakage. Patient will benefit  from skilled therapy to reduce her symptoms and improve strength.     Rehab Potential  Good    Clinical Impairments Affecting Rehab Potential  bilateral knee pain, fibromyalgia, IBS, hystory of abdomial surgery and vaginal deliveries with tearing    PT Frequency  1x / week    PT Duration  8 weeks    PT Treatment/Interventions  ADLs/Self Care Home Management;Biofeedback;Moist Heat;Traction;Ultrasound;DME Instruction;Gait training;Neuromuscular re-education;Balance training;Therapeutic exercise;Therapeutic activities;Functional mobility training;Patient/family education;Manual techniques;Passive range of motion;Dry needling;Vestibular;Visual/perceptual remediation/compensation    PT Next Visit Plan  abdominal soft tissue work, pelvic floor soft tissue work, abdominal bracing, pelvic floor contract then bulge    PT Home Exercise Plan  progress as needed    Consulted and Agree with Plan of Care  Patient       Patient will benefit from skilled therapeutic intervention in order to improve the following deficits and impairments:  Decreased activity tolerance, Decreased balance, Decreased mobility, Decreased knowledge of precautions, Decreased knowledge of use of DME, Dizziness, Difficulty walking, Postural dysfunction  Visit Diagnosis: Other muscle spasm  Muscle weakness (generalized)  Unspecified lack of coordination     Problem List Patient Active Problem List   Diagnosis Date Noted  . Marijuana use, continuous 03/19/2017    Earlie Counts, PT 10/14/17 10:56 AM   [facility] [address], [place], [place], [address] Phone: [phone]   Fax:  [phone]  Name: Stacy Guzman MRN: [id number] Date of Birth: 1971/03/16

## 2017-10-15 ENCOUNTER — Ambulatory Visit: Payer: 59 | Attending: Neurology | Admitting: Physical Therapy

## 2017-10-15 ENCOUNTER — Encounter: Payer: Self-pay | Admitting: Physical Therapy

## 2017-10-15 DIAGNOSIS — R262 Difficulty in walking, not elsewhere classified: Secondary | ICD-10-CM | POA: Diagnosis present

## 2017-10-15 DIAGNOSIS — R29818 Other symptoms and signs involving the nervous system: Secondary | ICD-10-CM | POA: Insufficient documentation

## 2017-10-15 DIAGNOSIS — M6281 Muscle weakness (generalized): Secondary | ICD-10-CM | POA: Insufficient documentation

## 2017-10-15 DIAGNOSIS — R2681 Unsteadiness on feet: Secondary | ICD-10-CM | POA: Diagnosis present

## 2017-10-15 DIAGNOSIS — R42 Dizziness and giddiness: Secondary | ICD-10-CM | POA: Insufficient documentation

## 2017-10-15 NOTE — Therapy (Signed)
[facility] [address] [place], [place], [address] Phone: [phone]   Fax:  [phone]  Physical Therapy Treatment  Patient Details  Name: Stacy Guzman MRN: [id number] Date of Birth: 1970/12/21 Referring Provider: Metta Clines DO   Encounter Date: 10/15/2017  PT End of Session - 10/15/17 0956    Visit Number  7    Number of Visits  19 pelvic 10 + vertigo 9    Date for PT Re-Evaluation  11/11/17 vertigo 11/16/2017    Authorization Type  THN; [employer] medicare    Authorization Time Period  -- vertigo 09/17/17-11/16/17    PT Start Time  0850    PT Stop Time  0933    PT Time Calculation (min)  43 min    Activity Tolerance  Patient tolerated treatment well    Behavior During Therapy  [facility] for tasks assessed/performed       Past Medical History:  Diagnosis Date  . Arthritis   . Bipolar 1 disorder ([place])   . Colon polyp   . Diabetes mellitus without complication ([place])   . GERD (gastroesophageal reflux disease)   . Hypercholesteremia   . Hypertension   . IBS (irritable bowel syndrome)   . Kidney stone   . Lung nodule   . Melanoma ([place])   . Migraine   . PTSD (post-traumatic stress disorder)   . Sleep apnea     Past Surgical History:  Procedure Laterality Date  . DILATION AND CURETTAGE OF UTERUS    . KNEE ARTHROSCOPY    . THROAT SURGERY    . TUBAL LIGATION  12/03/2015    There were no vitals filed for this visit.  Subjective Assessment - 10/15/17 0855    Subjective  Fell at home. Late at night and tripped over the chair. Hit her shoulder, but not really hurt. Dizziness 2/10 at rest today. Did her VOR x1 last night and "almost got sick" but it subsided within a few miniutes and able to do 3 reps. Has not done her 180 turns. Having her rt knee surgery tomorrow 1/4.     Pertinent History  Lt knee replacement, Rt knee injury, migrains, type 2 diabetes, hyperlipidemia, depression, anxiety, Bipolar disorder, hypertension,  IBS, fibromyalgia, lumbar spondylosis, sleep apnea and PTSD    Patient Stated Goals  get rid of severe cramping and bloating; stop feeling dizzy    Currently in Pain?  Yes    Pain Score  6     Pain Location  Abdomen    Pain Orientation  Right    Pain Descriptors / Indicators  Aching    Pain Type  Chronic pain    Pain Onset  More than a month ago    Pain Frequency  Intermittent      Treatment Performed the following exercises as prescribed below to educate for HEP. Utilized for balance training and strengthening hip/core.   HIP: Abduction - Standing    Stand facing counter with light UE support. Squeeze glutes. Raise leg out and slightly back with toes pointing forward. Hold for 3 seconds. __20_ reps per set, __1_ sets per day, __5_ days per week Hold onto a support.  Copyright  VHI. All rights reserved.    Hip Extension (Standing)    Stand facing counter with light arm support. Squeeze your buttocks and pelvic floor. Move right leg backward with straight knee. Hold for __3_ seconds. Then lift the other leg. Repeat __20_ times. Do _1__ times a day.  Copyright  VHI. All rights reserved.   Feet Together, Head Motion - Eyes Closed    With eyes closed and feet together, move head slowly, up and down x 10 reps. Then left and right x 10 reps. Then move head as if looking up to the left and down to the right x 10. Then move head as if looking up to the right and down to the left x 10.   Do __1__ sessions per day. ** Note, if knee is too sore to do feet side by side, can put the sore leg slightly in front of the other foot.   Copyright  VHI. All rights reserved.      [facility] PT Assessment - 10/15/17 0001      Functional Gait  Assessment   Gait assessed   Yes    Gait Level Surface  Walks 20 ft in less than 7 sec but greater than 5.5 sec, uses assistive device, slower speed, mild gait deviations, or deviates 6-10 in outside of the 12 in walkway width.    Change in Gait  Speed  Able to smoothly change walking speed without loss of balance or gait deviation. Deviate no more than 6 in outside of the 12 in walkway width.    Gait with Horizontal Head Turns  Performs head turns smoothly with slight change in gait velocity (eg, minor disruption to smooth gait path), deviates 6-10 in outside 12 in walkway width, or uses an assistive device.    Gait with Vertical Head Turns  Performs task with slight change in gait velocity (eg, minor disruption to smooth gait path), deviates 6 - 10 in outside 12 in walkway width or uses assistive device    Gait and Pivot Turn  Pivot turns safely within 3 sec and stops quickly with no loss of balance.    Step Over Obstacle  Is able to step over 2 stacked shoe boxes taped together (9 in total height) without changing gait speed. No evidence of imbalance.    Gait with Narrow Base of Support  Is able to ambulate for 10 steps heel to toe with no staggering.    Gait with Eyes Closed  Walks 20 ft, uses assistive device, slower speed, mild gait deviations, deviates 6-10 in outside 12 in walkway width. Ambulates 20 ft in less than 9 sec but greater than 7 sec.    Ambulating Backwards  Walks 20 ft, uses assistive device, slower speed, mild gait deviations, deviates 6-10 in outside 12 in walkway width.    Steps  Alternating feet, must use rail.    Total Score  24                          PT Education - 10/15/17 0956    Education provided  Yes    Education Details  see additions to HEP    Person(s) Educated  Patient    Methods  Explanation;Demonstration;Handout    Comprehension  Verbalized understanding;Returned demonstration       PT Short Term Goals - 10/15/17 0934      PT SHORT TERM GOAL #1   Title  Patient will be independent with HEP (Target for all STGs 10/17/16)    Time  4    Period  Weeks    Status  Achieved      PT SHORT TERM GOAL #2   Title  Patient will complete SOT and STG/LTG to be set as appropriate.  Baseline  10/16/17 deferred    Time  4    Period  Weeks    Status  Deferred      PT SHORT TERM GOAL #3   Title  Complete FGA +/- Berg for standardized balance assessment and STG/LTGs set as appropriate    Baseline  10/15/17 24/30    Time  4    Period  Weeks    Status  Achieved      PT SHORT TERM GOAL #4   Title  Patient will complete DHi and LTG set as appropriate.    Baseline  09/30/17  42/100    Time  4    Period  Weeks    Status  Achieved      PT SHORT TERM GOAL #5   Title  DHI score will decrease to <=30 to demonstrate progress and move from moderate handicap category to mild.    Baseline  09/30/17  42%    Time  4    Period  Weeks    Status  On-going        PT Long Term Goals - 10/15/17 0950      PT LONG TERM GOAL #1   Title  Patient will be independent with updated HEP (Target for all LTGs 11/16/17)    Time  8    Period  Weeks    Status  New      PT LONG TERM GOAL #2   Title  Further LTGs to be set as additional assessments completed (see STGs)    Baseline  10/15/17 met    Status  Achieved      PT LONG TERM GOAL #3   Title  Patient will improve FGA to >=26/30 demonstrating improved balance. (due 11/16/17)    Status  New      PT LONG TERM GOAL #4   Title  DHI score will decrease to <=30 to demonstrate progress and move from moderate handicap category to mild. (due 11/16/17)    Baseline  09/30/17 42%    Status  New            Plan - 10/15/17 1011    Clinical Impression Statement  Session focused on assessing STGs and updating HEP with balance exercises. Patient met 3 of 5 STGs, with one goal deferred (decided not to complete SOT in light of other assessments completed) and one goal ongoing (moved to LTGs). Patient reports improvement with dizziness (less frequent and recovers more quickly). Anticipate patient can continue to benefit from PT. Will determine ability to participate/progress s/p knee surgery (may need to put on hold, however pt is hesitant to do this as  she feels PT is helping her).     Rehab Potential  Good    Clinical Impairments Affecting Rehab Potential  bilateral knee pain, fibromyalgia, IBS,     PT Frequency  1x / week    PT Duration  8 weeks    PT Treatment/Interventions  ADLs/Self Care Home Management;Biofeedback;Moist Heat;Traction;Ultrasound;DME Instruction;Gait training;Neuromuscular re-education;Balance training;Therapeutic exercise;Therapeutic activities;Functional mobility training;Patient/family education;Manual techniques;Passive range of motion;Dry needling;Vestibular;Visual/perceptual remediation/compensation    PT Next Visit Plan  check tolerance/progress with VORx1; check 180 turns for HEP; check new ex's given 1/3    Consulted and Agree with Plan of Care  Patient       Patient will benefit from skilled therapeutic intervention in order to improve the following deficits and impairments:  Decreased activity tolerance, Decreased balance, Decreased mobility, Decreased knowledge of precautions, Decreased knowledge of use  of DME, Dizziness, Difficulty walking, Postural dysfunction  Visit Diagnosis: Dizziness and giddiness  Unsteadiness on feet  Other symptoms and signs involving the nervous system     Problem List Patient Active Problem List   Diagnosis Date Noted  . Marijuana use, continuous 03/19/2017    Rexanne Mano, PT 10/15/2017, 10:26 AM  [facility] [address] [place], [place], [address] Phone: [phone]   Fax:  [phone]  Name: Stacy Guzman MRN: [id number] Date of Birth: 1971-08-23

## 2017-10-15 NOTE — Patient Instructions (Signed)
HIP: Abduction - Standing    Stand facing counter with light UE support. Squeeze glutes. Raise leg out and slightly back with toes pointing forward. Hold for 3 seconds. __20_ reps per set, __1_ sets per day, __5_ days per week Hold onto a support.  Copyright  VHI. All rights reserved.    Hip Extension (Standing)    Stand facing counter with light arm support. Squeeze your buttocks and pelvic floor. Move right leg backward with straight knee. Hold for __3_ seconds. Then lift the other leg. Repeat __20_ times. Do _1__ times a day.    Copyright  VHI. All rights reserved.   Feet Together, Head Motion - Eyes Closed    With eyes closed and feet together, move head slowly, up and down x 10 reps. Then left and right x 10 reps. Then move head as if looking up to the left and down to the right x 10. Then move head as if looking up to the right and down to the left x 10.   Do __1__ sessions per day. ** Note, if knee is too sore to do feet side by side, can put the sore leg slightly in front of the other foot.   Copyright  VHI. All rights reserved.

## 2017-10-20 ENCOUNTER — Encounter: Payer: 59 | Admitting: Physical Therapy

## 2017-10-27 ENCOUNTER — Encounter: Payer: 59 | Admitting: Physical Therapy

## 2017-10-29 NOTE — Progress Notes (Addendum)
Rcvd PA rqst fax form for Ajovy from [employer]), form faxed back to [phone] [employer] # [phone]  Rcvd fax from [employer] rqstng additional information, completed and faxed back to [phone]  Also rcvd Rx rqst form from [employer], signed and faxed back to [phone]

## 2017-11-02 ENCOUNTER — Encounter: Payer: Self-pay | Admitting: Physical Therapy

## 2017-11-03 ENCOUNTER — Ambulatory Visit: Payer: 59 | Admitting: Physical Therapy

## 2017-11-10 ENCOUNTER — Encounter: Payer: Self-pay | Admitting: Physical Therapy

## 2017-11-10 ENCOUNTER — Ambulatory Visit: Payer: 59 | Admitting: Physical Therapy

## 2017-11-10 DIAGNOSIS — M6281 Muscle weakness (generalized): Secondary | ICD-10-CM

## 2017-11-10 DIAGNOSIS — R42 Dizziness and giddiness: Secondary | ICD-10-CM

## 2017-11-10 DIAGNOSIS — R29818 Other symptoms and signs involving the nervous system: Secondary | ICD-10-CM

## 2017-11-10 DIAGNOSIS — R262 Difficulty in walking, not elsewhere classified: Secondary | ICD-10-CM

## 2017-11-10 DIAGNOSIS — R2681 Unsteadiness on feet: Secondary | ICD-10-CM

## 2017-11-10 NOTE — Patient Instructions (Signed)
Feet Partial Heel-Toe (Compliant Surface) Head Motion - Eyes Closed    Stand on compliant surface: ___pillow, folded blanket_____ with right foot partially in front of the other. Close eyes and move head slowly, diagonally left x 10 and then diagonally right x 10. Switch and put left foot partially in front.  Repeat __1__ times per session. Do __2__ sessions per day.  Copyright  VHI. All rights reserved.

## 2017-11-10 NOTE — Therapy (Signed)
[facility] [address] [place], [place], [address] Phone: [phone]   Fax:  [phone]  Physical Therapy Treatment  Patient Details  Name: Stacy Guzman MRN: [id number] Date of Birth: [birth year] Referring Provider: Metta Clines DO   Encounter Date: 11/10/2017  PT End of Session - 11/10/17 2044    Visit Number  8    Number of Visits  19 pelvic 10 + vertigo 9    Date for PT Re-Evaluation  11/11/17 vertigo 11/16/2017    Authorization Type  THN; [employer] medicare    Authorization Time Period  -- vertigo 09/17/17-11/16/17    PT Start Time  1018    PT Stop Time  1100    PT Time Calculation (min)  42 min    Activity Tolerance  Patient tolerated treatment well    Behavior During Therapy  [facility] for tasks assessed/performed       Past Medical History:  Diagnosis Date  . Arthritis   . Bipolar 1 disorder ([place])   . Colon polyp   . Diabetes mellitus without complication ([place])   . GERD (gastroesophageal reflux disease)   . Hypercholesteremia   . Hypertension   . IBS (irritable bowel syndrome)   . Kidney stone   . Lung nodule   . Melanoma ([place])   . Migraine   . PTSD (post-traumatic stress disorder)   . Sleep apnea     Past Surgical History:  Procedure Laterality Date  . DILATION AND CURETTAGE OF UTERUS    . KNEE ARTHROSCOPY    . THROAT SURGERY    . TUBAL LIGATION  12/03/2015    There were no vitals filed for this visit.  Subjective Assessment - 11/10/17 1020    Subjective  Still feels like something is in her ears. It's constant. "Feels like something is crawling inside my ears" Hearing has diminished bilaterally, equally. Has not fallen recently. Doing well since knee surgery and walking better.     Pertinent History  Lt knee replacement, Rt knee injury, migrains, type 2 diabetes, hyperlipidemia, depression, anxiety, Bipolar disorder, hypertension, IBS, fibromyalgia, lumbar spondylosis, sleep apnea and PTSD    Patient  Stated Goals  get rid of severe cramping and bloating; stop feeling dizzy    Pain Onset  More than a month ago                      [facility] Adult PT Treatment/Exercise - 11/10/17 2034      Ambulation/Gait   Ambulation/Gait Assistance  6: Modified independent (Device/Increase time)    Ambulation Distance (Feet)  80 Feet 120    Assistive device  Straight cane    Gait Pattern  Trunk flexed;Decreased stride length;Antalgic;Step-through pattern;Decreased weight shift to right    Ambulation Surface  Indoor          Balance Exercises - 11/10/17 2037      Balance Exercises: Standing   Standing Eyes Opened  Narrow base of support (BOS);Head turns;Foam/compliant surface;Solid surface feet together; partial tandem lt and rt    Standing Eyes Closed  Narrow base of support (BOS);Head turns;Foam/compliant surface;Solid surface feet together    SLS with Vectors  Foam/compliant surface knock cone over and turn upright with single foot    Gait with Head Turns  Forward;Retro backward with incr symptoms    Retro Gait  3 reps next to counter    Sidestepping  4 reps EO, EC no UE support    Marching Limitations  slow (3 sec hold) solid floor vs red vs blue mat; no UE support; up to min assist to maintain balance      OTAGO PROGRAM   Toe Walk  No support 4 lengths at counter        PT Education - 11/10/17 2043    Education provided  Yes    Education Details  update to HEp    Person(s) Educated  Patient    Methods  Explanation;Demonstration    Comprehension  Verbalized understanding;Returned demonstration;Need further instruction       PT Short Term Goals - 10/15/17 0934      PT SHORT TERM GOAL #1   Title  Patient will be independent with HEP (Target for all STGs 10/17/16)    Time  4    Period  Weeks    Status  Achieved      PT SHORT TERM GOAL #2   Title  Patient will complete SOT and STG/LTG to be set as appropriate.     Baseline  10/16/17 deferred    Time  4    Period   Weeks    Status  Deferred      PT SHORT TERM GOAL #3   Title  Complete FGA +/- Berg for standardized balance assessment and STG/LTGs set as appropriate    Baseline  10/15/17 24/30    Time  4    Period  Weeks    Status  Achieved      PT SHORT TERM GOAL #4   Title  Patient will complete DHi and LTG set as appropriate.    Baseline  09/30/17  42/100    Time  4    Period  Weeks    Status  Achieved      PT SHORT TERM GOAL #5   Title  DHI score will decrease to <=30 to demonstrate progress and move from moderate handicap category to mild.    Baseline  09/30/17  42%    Time  4    Period  Weeks    Status  On-going        PT Long Term Goals - 10/15/17 0950      PT LONG TERM GOAL #1   Title  Patient will be independent with updated HEP (Target for all LTGs 11/16/17)    Time  8    Period  Weeks    Status  New      PT LONG TERM GOAL #2   Title  Further LTGs to be set as additional assessments completed (see STGs)    Baseline  10/15/17 met    Status  Achieved      PT LONG TERM GOAL #3   Title  Patient will improve FGA to >=26/30 demonstrating improved balance. (due 11/16/17)    Status  New      PT LONG TERM GOAL #4   Title  DHI score will decrease to <=30 to demonstrate progress and move from moderate handicap category to mild. (due 11/16/17)    Baseline  09/30/17 42%    Status  New            Plan - 11/10/17 2047    Clinical Impression Statement  Patient returns for first time s/p knee surgery (~ 3 weeks ago). Overall tolerating balance and vestibular training with no increase in knee pain. Will plan to assess progress towards LTGs next visit and anticipate complete re-certification (if deficits persist and warrant continued PT).     Rehab  Potential  Good    Clinical Impairments Affecting Rehab Potential  bilateral knee pain, fibromyalgia, IBS,     PT Frequency  1x / week    PT Duration  8 weeks    PT Treatment/Interventions  ADLs/Self Care Home Management;Biofeedback;Moist  Heat;Traction;Ultrasound;DME Instruction;Gait training;Neuromuscular re-education;Balance training;Therapeutic exercise;Therapeutic activities;Functional mobility training;Patient/family education;Manual techniques;Passive range of motion;Dry needling;Vestibular;Visual/perceptual remediation/compensation    PT Next Visit Plan  Check LTGs and discuss cont PT x ?4 weeks; check tolerance/progress with VORx1; check 180 turns for HEP; check new ex's given 1/3    Consulted and Agree with Plan of Care  Patient       Patient will benefit from skilled therapeutic intervention in order to improve the following deficits and impairments:  Decreased activity tolerance, Decreased balance, Decreased mobility, Decreased knowledge of precautions, Decreased knowledge of use of DME, Dizziness, Difficulty walking, Postural dysfunction  Visit Diagnosis: Dizziness and giddiness  Unsteadiness on feet  Other symptoms and signs involving the nervous system  Muscle weakness (generalized)  Difficulty in walking, not elsewhere classified     Problem List Patient Active Problem List   Diagnosis Date Noted  . Marijuana use, continuous 03/19/2017    Rexanne Mano, PT 11/10/2017, 8:54 PM  [facility] [address] [place], [place], [address] Phone: [phone]   Fax:  [phone]  Name: Stacy Guzman MRN: [id number] Date of Birth: 08-13-1971

## 2017-11-13 ENCOUNTER — Encounter: Payer: Self-pay | Admitting: Physical Therapy

## 2017-11-13 ENCOUNTER — Ambulatory Visit: Payer: 59 | Attending: Obstetrics and Gynecology | Admitting: Physical Therapy

## 2017-11-13 DIAGNOSIS — R279 Unspecified lack of coordination: Secondary | ICD-10-CM | POA: Insufficient documentation

## 2017-11-13 DIAGNOSIS — M62838 Other muscle spasm: Secondary | ICD-10-CM

## 2017-11-13 DIAGNOSIS — M6281 Muscle weakness (generalized): Secondary | ICD-10-CM | POA: Diagnosis not present

## 2017-11-13 NOTE — Therapy (Signed)
[facility] [address] [place], [place], [address] Phone: [phone]   Fax:  [phone]  Physical Therapy Treatment  Patient Details  Name: Stacy Guzman MRN: [id number] Date of Birth: May 27, 1971 Referring Provider: Dr. Thurnell Lose   Encounter Date: 11/13/2017  PT End of Session - 11/13/17 0854    Visit Number  9    Date for PT Re-Evaluation  01/06/18 pelvic    Authorization Type  THN; [employer] medicare    PT Start Time  0845    PT Stop Time  0925    PT Time Calculation (min)  40 min    Activity Tolerance  Patient tolerated treatment well    Behavior During Therapy  [facility] for tasks assessed/performed       Past Medical History:  Diagnosis Date  . Arthritis   . Bipolar 1 disorder ([place])   . Colon polyp   . Diabetes mellitus without complication ([place])   . GERD (gastroesophageal reflux disease)   . Hypercholesteremia   . Hypertension   . IBS (irritable bowel syndrome)   . Kidney stone   . Lung nodule   . Melanoma ([place])   . Migraine   . PTSD (post-traumatic stress disorder)   . Sleep apnea     Past Surgical History:  Procedure Laterality Date  . DILATION AND CURETTAGE OF UTERUS    . KNEE ARTHROSCOPY    . THROAT SURGERY    . TUBAL LIGATION  12/03/2015    There were no vitals filed for this visit.  Subjective Assessment - 11/13/17 0849    Subjective  Abdominal issues are 80% better.  If I get stressed out I have increased pain.  I still have IBS.     Pertinent History  Lt knee replacement, Rt knee injury, migrains, type 2 diabetes, hyperlipidemia, depression, anxiety, Bipolar disorder, hypertension, IBS, fibromyalgia, lumbar spondylosis, sleep apnea and PTSD    Limitations  Other (comment) intercourse    Patient Stated Goals  get rid of severe cramping and bloating; stop feeling dizzy    Currently in Pain?  Yes    Pain Score  3     Pain Location  Abdomen    Pain Orientation  Right    Pain Descriptors  / Indicators  Aching    Pain Type  Chronic pain    Pain Onset  More than a month ago    Pain Frequency  Intermittent    Aggravating Factors   abdominal massage    Pain Relieving Factors  heat and ice, passing the gas    Multiple Pain Sites  No         [facility] PT Assessment - 11/13/17 0001      Assessment   Medical Diagnosis  N94.10 Unspecified Dyspareunia    Referring Provider  Dr. Thurnell Lose    Onset Date/Surgical Date  -- 2 years ago    Prior Therapy  none      Precautions   Precautions  Fall      Restrictions   Weight Bearing Restrictions  No      [place] residence      Prior Function   Level of Independence  Independent      Cognition   Overall Cognitive Status  Within Functional Limits for tasks assessed      Posture/Postural Control   Posture/Postural Control  Postural limitations    Postural Limitations  Rounded Shoulders;Forward head  Strength   Overall Strength Comments  core weakness 3/5      Palpation   SI assessment   correct alignment               Pelvic Floor Special Questions - 11/13/17 0001    Strength  fair squeeze, definite lift        [facility] Adult PT Treatment/Exercise - 11/13/17 0001      Lumbar Exercises: Supine   Ab Set  5 reps;5 seconds    Clam  20 reps;1 second abdominal bracing      Manual Therapy   Manual Therapy  Soft tissue mobilization    Soft tissue mobilization  abdominal massage to upper and lower to release the tissue             PT Education - 11/13/17 0926    Education provided  Yes    Education Details  abdominal strength    Person(s) Educated  Patient    Methods  Explanation;Demonstration;Verbal cues;Handout    Comprehension  Returned demonstration;Verbalized understanding       PT Short Term Goals - 10/15/17 0934      PT SHORT TERM GOAL #1   Title  Patient will be independent with HEP (Target for all STGs 10/17/16)    Time  4    Period  Weeks     Status  Achieved      PT SHORT TERM GOAL #2   Title  Patient will complete SOT and STG/LTG to be set as appropriate.     Baseline  10/16/17 deferred    Time  4    Period  Weeks    Status  Deferred      PT SHORT TERM GOAL #3   Title  Complete FGA +/- Berg for standardized balance assessment and STG/LTGs set as appropriate    Baseline  10/15/17 24/30    Time  4    Period  Weeks    Status  Achieved      PT SHORT TERM GOAL #4   Title  Patient will complete DHi and LTG set as appropriate.    Baseline  09/30/17  42/100    Time  4    Period  Weeks    Status  Achieved      PT SHORT TERM GOAL #5   Title  DHI score will decrease to <=30 to demonstrate progress and move from moderate handicap category to mild.    Baseline  09/30/17  42%    Time  4    Period  Weeks    Status  On-going        PT Long Term Goals - 11/13/17 1950      PT LONG TERM GOAL #5   Title  pt will be able to relax pelvic floor in order to pass stool, urine, or gas without straining or bearing down    Baseline  urination 50% better; urinary leakage is 50% better; bowel movement 80% better    Time  8    Period  Weeks    Status  On-going      PT LONG TERM GOAL #6   Title  Patient will report 75% less pain and cramping after intercourse    Baseline  no pain    Time  8    Period  Weeks    Status  Achieved            Plan - 11/13/17 0927    Clinical Impression Statement  Patient reports urinary leakage is 50%.  Patient strains 50% less with urniation and 80% better for bowel movements.  Patient reports no pain with intercourse.  Patient reports her overall pelvic pain is 80% better.  Patient pelvic floor strength 3/5.  Patient abdominal strength is 3/5.  Patient has abdominal and pelvic  trigger points in the muscles.  Patient will benefit from skilled PT to reduce pain and improve function.     Rehab Potential  Good    Clinical Impairments Affecting Rehab Potential  bilateral knee pain, fibromyalgia, IBS,      PT Frequency  1x / week    PT Duration  8 weeks    PT Treatment/Interventions  ADLs/Self Care Home Management;Biofeedback;Neuromuscular re-education;Patient/family education;Therapeutic exercise;Therapeutic activities;Electrical Stimulation;Cryotherapy;Moist Heat;Manual techniques;Dry needling    PT Next Visit Plan  soft tissue work on pelvic floor; abdominal and pelvic floor strength    PT Home Exercise Plan  progress as needed    Recommended Other Services  sent renewal    Consulted and Agree with Plan of Care  Patient       Patient will benefit from skilled therapeutic intervention in order to improve the following deficits and impairments:  Postural dysfunction, Pain, Impaired tone, Increased muscle spasms, Decreased strength  Visit Diagnosis: Muscle weakness (generalized) - Plan: PT plan of care cert/re-cert  Other muscle spasm - Plan: PT plan of care cert/re-cert  Unspecified lack of coordination - Plan: PT plan of care cert/re-cert     Problem List Patient Active Problem List   Diagnosis Date Noted  . Marijuana use, continuous 03/19/2017    Earlie Counts, PT 11/13/17 9:34 AM   [facility] [address], [place], [place], [address] Phone: [phone]   Fax:  [phone]  Name: Stacy Guzman MRN: [id number] Date of Birth: 17-Nov-1970

## 2017-11-13 NOTE — Patient Instructions (Addendum)
Isometric Hold (Hook-Lying)    Lie with hips and knees bent. Slowly inhale, and then exhale. Pull navel toward spine and Hold for _5__ seconds. Continue to breathe in and out during hold. Rest for _5__ seconds. Repeat __5_ times. Do __1_ times a day.   Copyright  VHI. All rights reserved.    Bracing With Knee Fallout (Hook-Lying)    With neutral spine, tighten pelvic floor and abdominals and hold. Alternating legs, drop knee out to side. Keep opposite hip still. Repeat __10_ times. Do __1_ times a day.   Copyright  VHI. All rights reserved.    Patient lying supine brings one legs into 90 degrees of knee flexion and 90 degrees of hip flexion  With same side hand placed against each knee, patient is going to push the knees into the hands and apply resistance with hands   Patient is going to use  20% VMC (voluntary muscle contraction) 10 x each leg  Lumbar Rotation (Non-Weight Bearing)    Feet on floor, slowly rock knees from side to side in small, pain-free range of motion. Allow lower back to rotate slightly. Repeat __10__ times per set. Do __1__ sets per session. Do ___1_ sessions per day.  http://orth.exer.us/160   Copyright  VHI. All rights reserved.  [facility] [address], [place], [place] [address] Phone # [phone] Fax [phone]

## 2017-11-17 ENCOUNTER — Ambulatory Visit: Payer: 59 | Attending: Neurology | Admitting: Physical Therapy

## 2017-11-17 ENCOUNTER — Encounter: Payer: Self-pay | Admitting: Physical Therapy

## 2017-11-17 ENCOUNTER — Other Ambulatory Visit: Payer: Self-pay | Admitting: Family Medicine

## 2017-11-17 ENCOUNTER — Ambulatory Visit
Admission: RE | Admit: 2017-11-17 | Discharge: 2017-11-17 | Disposition: A | Discharge status: Home / Self Care | Payer: 59 | Source: Ambulatory Visit | Attending: Family Medicine | Admitting: Family Medicine

## 2017-11-17 DIAGNOSIS — F17200 Nicotine dependence, unspecified, uncomplicated: Secondary | ICD-10-CM

## 2017-11-17 DIAGNOSIS — R42 Dizziness and giddiness: Secondary | ICD-10-CM | POA: Diagnosis not present

## 2017-11-17 DIAGNOSIS — R042 Hemoptysis: Secondary | ICD-10-CM

## 2017-11-17 DIAGNOSIS — R2681 Unsteadiness on feet: Secondary | ICD-10-CM | POA: Diagnosis present

## 2017-11-17 NOTE — Therapy (Signed)
[facility] [address] [place], [place], [address] Phone: [phone]   Fax:  [phone]  Physical Therapy Treatment and Discharge  Patient Details  Name: Stacy Guzman MRN: [id number] Date of Birth: [birth year] Referring Provider: Dr. Thurnell Lose   Encounter Date: 11/17/2017  PT End of Session - 11/17/17 1301    Visit Number  10    Number of Visits  19 pelvic 10 + vertigo 9    Date for PT Re-Evaluation  11/11/17 vertigo 11/16/2017    Authorization Type  THN; [employer] medicare    Authorization Time Period  -- vertigo 09/17/17-11/16/17    PT Start Time  1019    PT Stop Time  1055 entire time not needed due to discharge    PT Time Calculation (min)  36 min    Activity Tolerance  Patient tolerated treatment well    Behavior During Therapy  [facility] for tasks assessed/performed       Past Medical History:  Diagnosis Date  . Arthritis   . Bipolar 1 disorder ([place])   . Colon polyp   . Diabetes mellitus without complication ([place])   . GERD (gastroesophageal reflux disease)   . Hypercholesteremia   . Hypertension   . IBS (irritable bowel syndrome)   . Kidney stone   . Lung nodule   . Melanoma ([place])   . Migraine   . PTSD (post-traumatic stress disorder)   . Sleep apnea     Past Surgical History:  Procedure Laterality Date  . DILATION AND CURETTAGE OF UTERUS    . KNEE ARTHROSCOPY    . THROAT SURGERY    . TUBAL LIGATION  12/03/2015    There were no vitals filed for this visit.  Subjective Assessment - 11/17/17 1023    Subjective  Has been coughing all weekend and coughed up blood yesterday. Has an MD appt today after PT. Feels like she has pneumonia (has had in the past). 4/10 queezy/spinny feeling.     Pertinent History  Lt knee replacement, Rt knee injury, migrains, type 2 diabetes, hyperlipidemia, depression, anxiety, Bipolar disorder, hypertension, IBS, fibromyalgia, lumbar spondylosis, sleep apnea and PTSD    Patient  Stated Goals  get rid of severe cramping and bloating; stop feeling dizzy    Currently in Pain?  Yes    Pain Score  5     Pain Location  Back    Pain Orientation  Lower    Pain Descriptors / Indicators  Sore    Pain Type  Chronic pain    Pain Onset  More than a month ago    Pain Frequency  Intermittent    Aggravating Factors   moving wrong    Pain Relieving Factors  movement helps         [facility] PT Assessment - 11/17/17 1032      Functional Gait  Assessment   Gait Level Surface  Walks 20 ft in less than 5.5 sec, no assistive devices, good speed, no evidence for imbalance, normal gait pattern, deviates no more than 6 in outside of the 12 in walkway width.    Change in Gait Speed  Able to smoothly change walking speed without loss of balance or gait deviation. Deviate no more than 6 in outside of the 12 in walkway width.    Gait with Horizontal Head Turns  Performs head turns smoothly with no change in gait. Deviates no more than 6 in outside 12 in walkway width  Gait with Vertical Head Turns  Performs task with slight change in gait velocity (eg, minor disruption to smooth gait path), deviates 6 - 10 in outside 12 in walkway width or uses assistive device    Gait and Pivot Turn  Pivot turns safely within 3 sec and stops quickly with no loss of balance.    Step Over Obstacle  Is able to step over 2 stacked shoe boxes taped together (9 in total height) without changing gait speed. No evidence of imbalance.    Gait with Narrow Base of Support  Is able to ambulate for 10 steps heel to toe with no staggering.    Gait with Eyes Closed  Walks 20 ft, uses assistive device, slower speed, mild gait deviations, deviates 6-10 in outside 12 in walkway width. Ambulates 20 ft in less than 9 sec but greater than 7 sec.    Ambulating Backwards  Walks 20 ft, no assistive devices, good speed, no evidence for imbalance, normal gait    Steps  Alternating feet, no rail.    Total Score  28                           PT Education - 11/17/17 1300    Education provided  Yes    Education Details  results of re-assessments; has met goals and ready for d/c (pt agrees she knows what she needs to do); educated in how to advance VOR x 1 and how to back down #sessions if feeling better--may not be able to wean completely off but may get down to one time every other day    Person(s) Educated  Patient    Methods  Explanation    Comprehension  Verbalized understanding       PT Short Term Goals - 10/15/17 0934      PT SHORT TERM GOAL #1   Title  Patient will be independent with HEP (Target for all STGs 10/17/16)    Time  4    Period  Weeks    Status  Achieved      PT SHORT TERM GOAL #2   Title  Patient will complete SOT and STG/LTG to be set as appropriate.     Baseline  10/16/17 deferred    Time  4    Period  Weeks    Status  Deferred      PT SHORT TERM GOAL #3   Title  Complete FGA +/- Berg for standardized balance assessment and STG/LTGs set as appropriate    Baseline  10/15/17 24/30    Time  4    Period  Weeks    Status  Achieved      PT SHORT TERM GOAL #4   Title  Patient will complete DHi and LTG set as appropriate.    Baseline  09/30/17  42/100    Time  4    Period  Weeks    Status  Achieved      PT SHORT TERM GOAL #5   Title  DHI score will decrease to <=30 to demonstrate progress and move from moderate handicap category to mild.    Baseline  09/30/17  42%    Time  4    Period  Weeks    Status  On-going        PT Long Term Goals - 11/17/17 1304      PT LONG TERM GOAL #1   Title  Patient will be independent  with updated HEP (Target for all LTGs 11/16/17)    Time  8    Period  Weeks    Status  Achieved      PT LONG TERM GOAL #2   Title  Further LTGs to be set as additional assessments completed (see STGs)    Baseline  10/15/17 met    Status  Achieved      PT LONG TERM GOAL #3   Title  Patient will improve FGA to >=26/30 demonstrating  improved balance. (due 11/16/17)    Baseline  2/5 28/30    Status  Achieved      PT LONG TERM GOAL #4   Title  DHI score will decrease to <=30 to demonstrate progress and move from moderate handicap category to mild. (due 11/16/17)    Baseline  09/30/17 42%; 11/17/17 14%    Status  Achieved            Plan - 11/17/17 1618    Clinical Impression Statement  LTGs assessed with pt meeting 4 of 4 goals. She has dramatically improved based on her DHI score decreasing from 42 (moderate disability) to 14 (less than mild 16) and her FGA score increased from 24 to 28/30. Although she continues to have dizziness that waxes and wanes, she reports she is significantly better and agrees with plan to discharge from vestibular rehab.     Rehab Potential  Good    Clinical Impairments Affecting Rehab Potential  bilateral knee pain, fibromyalgia, IBS,     PT Frequency  1x / week    PT Duration  8 weeks    PT Treatment/Interventions  ADLs/Self Care Home Management;Biofeedback;Neuromuscular re-education;Patient/family education;Therapeutic exercise;Therapeutic activities;Electrical Stimulation;Cryotherapy;Moist Heat;Manual techniques;Dry needling    PT Home Exercise Plan  progress as needed    Consulted and Agree with Plan of Care  Patient       Patient will benefit from skilled therapeutic intervention in order to improve the following deficits and impairments:  Postural dysfunction, Pain, Impaired tone, Increased muscle spasms, Decreased strength  Visit Diagnosis: Dizziness and giddiness  Unsteadiness on feet     Problem List Patient Active Problem List   Diagnosis Date Noted  . Marijuana use, continuous 03/19/2017   PHYSICAL THERAPY DISCHARGE SUMMARY  Visits from Start of Care: 6 (for vestibular PT)  Current functional level related to goals / functional outcomes: Lesser fall risk with lesser impairment due to dizziness   Remaining deficits: Intermittent dizziness   Education /  Equipment: Instructed in HEP  Plan: Patient agrees to discharge.  Patient goals were met. Patient is being discharged due to meeting the stated rehab goals.  ?????       Rexanne Mano, PT 11/17/2017, 4:31 PM  [facility] [address] [place], [place], [address] Phone: [phone]   Fax:  [phone]  Name: Stacy Guzman MRN: [id number] Date of Birth: August 03, 1971

## 2017-11-18 DIAGNOSIS — R05 Cough: Secondary | ICD-10-CM | POA: Insufficient documentation

## 2017-11-18 DIAGNOSIS — R059 Cough, unspecified: Secondary | ICD-10-CM | POA: Insufficient documentation

## 2017-11-18 DIAGNOSIS — J381 Polyp of vocal cord and larynx: Secondary | ICD-10-CM | POA: Insufficient documentation

## 2017-11-18 DIAGNOSIS — K219 Gastro-esophageal reflux disease without esophagitis: Secondary | ICD-10-CM | POA: Insufficient documentation

## 2017-11-18 DIAGNOSIS — R42 Dizziness and giddiness: Secondary | ICD-10-CM | POA: Insufficient documentation

## 2017-11-18 DIAGNOSIS — R490 Dysphonia: Secondary | ICD-10-CM | POA: Insufficient documentation

## 2017-11-19 ENCOUNTER — Other Ambulatory Visit: Payer: Self-pay | Admitting: Neurology

## 2017-11-19 ENCOUNTER — Other Ambulatory Visit (HOSPITAL_COMMUNITY): Payer: Self-pay | Admitting: Family Medicine

## 2017-11-19 DIAGNOSIS — R9389 Abnormal findings on diagnostic imaging of other specified body structures: Secondary | ICD-10-CM

## 2017-11-23 ENCOUNTER — Other Ambulatory Visit (HOSPITAL_COMMUNITY): Payer: Self-pay | Admitting: Family Medicine

## 2017-11-23 ENCOUNTER — Ambulatory Visit (HOSPITAL_COMMUNITY)
Admission: RE | Admit: 2017-11-23 | Discharge: 2017-11-23 | Disposition: A | Discharge status: Home / Self Care | Payer: 59 | Source: Ambulatory Visit | Attending: Family Medicine | Admitting: Family Medicine

## 2017-11-23 DIAGNOSIS — R9389 Abnormal findings on diagnostic imaging of other specified body structures: Secondary | ICD-10-CM | POA: Diagnosis present

## 2017-11-23 DIAGNOSIS — J439 Emphysema, unspecified: Secondary | ICD-10-CM | POA: Insufficient documentation

## 2017-11-23 DIAGNOSIS — I2584 Coronary atherosclerosis due to calcified coronary lesion: Secondary | ICD-10-CM | POA: Insufficient documentation

## 2017-11-23 DIAGNOSIS — R918 Other nonspecific abnormal finding of lung field: Secondary | ICD-10-CM | POA: Insufficient documentation

## 2017-11-24 ENCOUNTER — Other Ambulatory Visit (HOSPITAL_COMMUNITY): Payer: Self-pay | Admitting: Family Medicine

## 2017-11-24 ENCOUNTER — Encounter: Payer: 59 | Admitting: Physical Therapy

## 2017-11-24 DIAGNOSIS — R9389 Abnormal findings on diagnostic imaging of other specified body structures: Secondary | ICD-10-CM

## 2017-11-25 ENCOUNTER — Encounter: Payer: 59 | Admitting: Physical Therapy

## 2017-12-09 ENCOUNTER — Encounter: Payer: Self-pay | Admitting: Physical Therapy

## 2017-12-09 ENCOUNTER — Ambulatory Visit: Payer: 59 | Admitting: Physical Therapy

## 2017-12-09 DIAGNOSIS — M6281 Muscle weakness (generalized): Secondary | ICD-10-CM

## 2017-12-09 DIAGNOSIS — M62838 Other muscle spasm: Secondary | ICD-10-CM

## 2017-12-09 DIAGNOSIS — R279 Unspecified lack of coordination: Secondary | ICD-10-CM

## 2017-12-09 NOTE — Therapy (Signed)
[facility] [address], [place], [place], [address] Phone: [phone]   Fax:  [phone]  Physical Therapy Treatment  Patient Details  Name: Stacy Guzman MRN: [id number] Date of Birth: 1971/07/10 Referring Provider: Dr. Thurnell Lose   Encounter Date: 12/09/2017  PT End of Session - 12/09/17 1001    Visit Number  11    Date for PT Re-Evaluation  01/06/18    Authorization Type  THN; [employer] medicare    PT Start Time  0930    PT Stop Time  1010    PT Time Calculation (min)  40 min    Activity Tolerance  Patient tolerated treatment well    Behavior During Therapy  [facility] for tasks assessed/performed       Past Medical History:  Diagnosis Date  . Arthritis   . Bipolar 1 disorder ([place])   . Colon polyp   . Diabetes mellitus without complication ([place])   . GERD (gastroesophageal reflux disease)   . Hypercholesteremia   . Hypertension   . IBS (irritable bowel syndrome)   . Kidney stone   . Lung nodule   . Melanoma ([place])   . Migraine   . PTSD (post-traumatic stress disorder)   . Sleep apnea     Past Surgical History:  Procedure Laterality Date  . DILATION AND CURETTAGE OF UTERUS    . KNEE ARTHROSCOPY    . THROAT SURGERY    . TUBAL LIGATION  12/03/2015    There were no vitals filed for this visit.  Subjective Assessment - 12/09/17 0935    Subjective  When I have stress I will have pain in the abdomen.  Patient reports 80% less cramping after therapy.  Straining to have a bowel movement is 80% better. Patient reports urinary leakage is 50% better.      Pertinent History  Lt knee replacement, Rt knee injury, migrains, type 2 diabetes, hyperlipidemia, depression, anxiety, Bipolar disorder, hypertension, IBS, fibromyalgia, lumbar spondylosis, sleep apnea and PTSD    Patient Stated Goals  get rid of severe cramping and bloating; stop feeling dizzy    Currently in Pain?  No/denies                       [facility] Adult PT Treatment/Exercise - 12/09/17 0001      Lumbar Exercises: Supine   Bridge with Ball Squeeze  10 reps pelvic floor contraction    Bridge with March  10 reps alternate, pelvic floor contraction    Other Supine Lumbar Exercises  alternate shoulder flexion with abdominal and pelvic floor contraction      Manual Therapy   Manual Therapy  Soft tissue mobilization    Soft tissue mobilization  lumbar and bilateral buttocks to reduce trigger points             PT Education - 12/09/17 1010    Education provided  Yes    Education Details  core strength    Person(s) Educated  Patient    Methods  Explanation;Demonstration;Verbal cues;Handout    Comprehension  Verbalized understanding;Returned demonstration       PT Short Term Goals - 10/15/17 0934      PT SHORT TERM GOAL #1   Title  Patient will be independent with HEP (Target for all STGs 10/17/16)    Time  4    Period  Weeks    Status  Achieved      PT SHORT TERM GOAL #2  Title  Patient will complete SOT and STG/LTG to be set as appropriate.     Baseline  10/16/17 deferred    Time  4    Period  Weeks    Status  Deferred      PT SHORT TERM GOAL #3   Title  Complete FGA +/- Berg for standardized balance assessment and STG/LTGs set as appropriate    Baseline  10/15/17 24/30    Time  4    Period  Weeks    Status  Achieved      PT SHORT TERM GOAL #4   Title  Patient will complete DHi and LTG set as appropriate.    Baseline  09/30/17  42/100    Time  4    Period  Weeks    Status  Achieved      PT SHORT TERM GOAL #5   Title  DHI score will decrease to <=30 to demonstrate progress and move from moderate handicap category to mild.    Baseline  09/30/17  42%    Time  4    Period  Weeks    Status  On-going        PT Long Term Goals - 12/09/17 1014      PT LONG TERM GOAL #5   Title  pt will be able to relax pelvic floor in order to pass stool, urine, or gas without straining  or bearing down    Baseline  urination 50% better; urinary leakage is 50% better; bowel movement 80% better    Time  8    Status  On-going      PT LONG TERM GOAL #6   Time  8    Period  Weeks    Status  Achieved            Plan - 12/09/17 1011    Clinical Impression Statement  Patient continues to get spasms in the rectal and vaginal area.  Patient reports the urinary leakage is 505 better. Patient reports her pain with intercourse is 80% better and 80% better with cramping after intercourse. Patient is learning home program to strengthen the core and pelvic floor muscles to further reduce urinary leakage.     Rehab Potential  Good    Clinical Impairments Affecting Rehab Potential  bilateral knee pain, fibromyalgia, IBS,     PT Frequency  1x / week    PT Duration  8 weeks    PT Treatment/Interventions  ADLs/Self Care Home Management;Biofeedback;Neuromuscular re-education;Patient/family education;Therapeutic exercise;Therapeutic activities;Electrical Stimulation;Cryotherapy;Moist Heat;Manual techniques;Dry needling    PT Next Visit Plan  soft tissue work on pelvic floor; abdominal and pelvic floor strength and prepare for discharge next few sessions     PT Home Exercise Plan  progress as needed    Consulted and Agree with Plan of Care  Patient       Patient will benefit from skilled therapeutic intervention in order to improve the following deficits and impairments:  Postural dysfunction, Pain, Impaired tone, Increased muscle spasms, Decreased strength  Visit Diagnosis: Other muscle spasm  Muscle weakness (generalized)  Unspecified lack of coordination     Problem List Patient Active Problem List   Diagnosis Date Noted  . Marijuana use, continuous 03/19/2017    Earlie Counts, PT 12/09/17 10:16 AM   [facility] [address], [place], [place], [address] Phone: [phone]   Fax:  [phone]  Name:  MELONDY BLANCHARD MRN: [id number] Date of Birth:  12/27/1970   

## 2017-12-09 NOTE — Patient Instructions (Addendum)
Bracing With Leg March (Hook-Lying)    With neutral spine, tighten pelvic floor and abdominals and hold. Alternating legs, lift foot _6__ inches and return to floor. Repeat _10__ times. Do __1_ times a day.   Copyright  VHI. All rights reserved.    Bracing With Arm Lift (Hook-Lying)    With neutral spine, tighten pelvic floor and abdominals and hold. Alternating arms, raise over head and return to side. Repeat _15__ times. Do _1__ times a day.   Copyright  VHI. All rights reserved.    Engage glutes, push pelvis up while squeezing ball/pillow between knees. Hold position maintaining straight line from shoulder to knee. 15 times 1 time per day  Strengthening: Hip Adduction (Side-Lying)    Tighten muscles on front of right thigh, then lift leg _2___ inches from surface, keeping knee locked.  Repeat _10___ times per set. Do ___1_ sets per session. Do __1__ sessions per day. Do on both legs, tighten abdominals, pelvic floor contraction http://orth.exer.us/624   Copyright  VHI. All rights reserved.    While lying on your side with your knees bent, draw up the top knee while keeping contact of your feet together.  Do not let your pelvis roll back during the lifting movement.   Contract pelvic floor and abdomen 10x bil.   [facility] [address] [place], [place] [address] Phone # [phone] Fax [phone]

## 2017-12-11 ENCOUNTER — Encounter: Payer: Self-pay | Admitting: Neurology

## 2017-12-11 ENCOUNTER — Ambulatory Visit (INDEPENDENT_AMBULATORY_CARE_PROVIDER_SITE_OTHER): Payer: 59 | Admitting: Neurology

## 2017-12-11 VITALS — HR 72 | Resp 16 | Ht 62.0 in | Wt 180.5 lb

## 2017-12-11 DIAGNOSIS — F17200 Nicotine dependence, unspecified, uncomplicated: Secondary | ICD-10-CM

## 2017-12-11 DIAGNOSIS — G43009 Migraine without aura, not intractable, without status migrainosus: Secondary | ICD-10-CM

## 2017-12-11 NOTE — Patient Instructions (Signed)
Migraine Recommendations: 1.  Stop the injection 2.  We will try non-pharmacologic therapy 3.  Limit use of pain relievers to no more than 2 days out of the week.  These medications include acetaminophen, ibuprofen, triptans and narcotics.  This will help reduce risk of rebound headaches. 4.  Be aware of common food triggers such as processed sweets, processed foods with nitrites (such as deli meat, hot dogs, sausages), foods with MSG, alcohol (such as wine), chocolate, certain cheeses, certain fruits (dried fruits, bananas, some citrus fruit), vinegar, diet soda. 4.  Avoid caffeine 5.  Routine exercise 6.  Proper sleep hygiene 7.  Stay adequately hydrated with water 8.  Keep a headache diary. 9.  Maintain proper stress management. 10.  Do not skip meals. 11.  Consider supplements:  Magnesium citrate 400mg  to 600mg  daily, riboflavin 400mg , Coenzyme Q 10 100mg  three times daily   Migraine Headache A migraine headache is a very strong throbbing pain on one side or both sides of your head. Migraines can also cause other symptoms. Talk with your doctor about what things may bring on (trigger) your migraine headaches. Follow these instructions at home: Medicines  Take over-the-counter and prescription medicines only as told by your doctor.  Do not drive or use heavy machinery while taking prescription pain medicine.  To prevent or treat constipation while you are taking prescription pain medicine, your doctor may recommend that you: ? Drink enough fluid to keep your pee (urine) clear or pale yellow. ? Take over-the-counter or prescription medicines. ? Eat foods that are high in fiber. These include fresh fruits and vegetables, whole grains, and beans. ? Limit foods that are high in fat and processed sugars. These include fried and sweet foods. Lifestyle  Avoid alcohol.  Do not use any products that contain nicotine or tobacco, such as cigarettes and e-cigarettes. If you need help  quitting, ask your doctor.  Get at least 8 hours of sleep every night.  Limit your stress. General instructions   Keep a journal to find out what may bring on your migraines. For example, write down: ? What you eat and drink. ? How much sleep you get. ? Any change in what you eat or drink. ? Any change in your medicines.  If you have a migraine: ? Avoid things that make your symptoms worse, such as bright lights. ? It may help to lie down in a dark, quiet room. ? Do not drive or use heavy machinery. ? Ask your doctor what activities are safe for you.  Keep all follow-up visits as told by your doctor. This is important. Contact a doctor if:  You get a migraine that is different or worse than your usual migraines. Get help right away if:  Your migraine gets very bad.  You have a fever.  You have a stiff neck.  You have trouble seeing.  Your muscles feel weak or like you cannot control them.  You start to lose your balance a lot.  You start to have trouble walking.  You pass out (faint). This information is not intended to replace advice given to you by your health care provider. Make sure you discuss any questions you have with your health care provider. Document Released: 07/08/2008 Document Revised: 04/18/2016 Document Reviewed: 03/17/2016 Elsevier Interactive Patient Education  2018 [employer].

## 2017-12-11 NOTE — Progress Notes (Signed)
NEUROLOGY FOLLOW UP OFFICE NOTE  ADRIANNE SHACKLETON [id number]  HISTORY OF PRESENT ILLNESS: KARLENE SOUTHARD is a 47 year old female with type 2 diabetes, hyperlipidemia, depression, anxiety, Bipolar disorder, hypertension, IBS, fibromyalgia, lumbar spondylosis, sleep apnea and PTSD who follows up for migraine.   UPDATE: Last visit, atenolol was decreased to 50mg  and she was started on Ajovy.  It was effective, decreasing headache frequency to 3 days per month.  However, she has a significant aversion to injections that she stopped this past month and has had 5 migraines in a row during her cycle.  Maxalt was discontinued as it was ineffective. Current NSAIDS:  no Current analgesics:  no Current triptans:   Current anti-emetic:  no Current muscle relaxants:  no Current anti-anxiolytic:  no Current sleep aide:  no Current Antihypertensive medications:  atenolol 50mg  Current Antidepressant medications:  no Current Anticonvulsant medications:  no Current anti-CGRP:  Ajovy Current Vitamins/Herbal/Supplements:  Stopped supplements Current Antihistamines/Decongestants:  no Other therapy:  No  Due to vertigo and worsening headache, MRI of brain without contrast was performed on 08/27/17, which was personally reviewed and was unremarkable.  She was sent for vestibular rehabilitation.  Assessment was negative for peripheral etiology.  She was also seen in PT for balance problems following knee surgery.  She endorsed sensation of something crawling in her ear.  She was evaluated by ENT.  Audiometric testing showed very mild symmetric prebycusis pattern, age related and not significant.  Tympanograms were normal.   Caffeine:  1 pot of coffee daily Alcohol:  no Smoker:  Yes (cigarettes and marijuana) Diet:  hydrates Exercise:  walks Depression/anxiety:  Yes.  Significant stressors over past year (divorce after over 21 years, her dad passed away, financial hardship).  She intentionally overdosed a year  ago.  She has no suicidal ideation currently. Sleep hygiene:  Okay, however she sleep walks.  She sees a sleep specialist.   HISTORY:  Onset:  Since teenager Location:  Bi-occipital radiating to bifrontal region Quality:  Vice-like Initial Intensity:  "12"/10 Aura:  no Prodrome:  no Postdrome:  no Associated symptoms:  Photophobia, phonophobia.  No nausea or visual disturbance.  She has not had any new worse headache of her life, waking up from sleep Initial Duration:  5 hours (sometimes days) Initial Frequency:  10 days per month, worse during her cycle Triggers/exacerbating factors:  Hormonal, chocolate, caffeine, stress Relieving factors:  rest Activity:  aggravates   Past NSAIDS:  All NSAIDS (ibuprofen, naproxen) make headache worse Past analgesics:  Tylenol and Excedrin make headache worse), tramadol Past abortive triptans:  Sumatriptan ([place]/tab), Relpax, Maxalt (ineffective)  Past muscle relaxants:  cyclobenzaprine Past anti-emetic:  no Past antihypertensive medications:  no Past antidepressant medications:  Cymbalta, amitriptyline, Effexor (side effects) Past anticonvulsant medications:  topiramate (side effects), Depakote (maybe many years ago), gabapentin (abdominal pain) Past vitamins/Herbal/Supplements:  no Past antihistamines/decongestants:  no Other past therapies:  Cervical or occipital ablations, Botox (worked but could not afford and does not like needles)   Family history of headache:  yes  PAST MEDICAL HISTORY: Past Medical History:  Diagnosis Date  . Arthritis   . Bipolar 1 disorder ([place])   . Colon polyp   . Diabetes mellitus without complication ([place])   . GERD (gastroesophageal reflux disease)   . Hypercholesteremia   . Hypertension   . IBS (irritable bowel syndrome)   . Kidney stone   . Lung nodule   . Melanoma ([place])   . Migraine   .  PTSD (post-traumatic stress disorder)   . Sleep apnea     MEDICATIONS: Current Outpatient Medications on File  Prior to Visit  Medication Sig Dispense Refill  . aspirin EC 81 MG tablet Take 81 mg by mouth daily.    Marland Kitchen atenolol (TENORMIN) 50 MG tablet Take 1 tablet (50 mg total) by mouth daily. 30 tablet 5  . b complex vitamins tablet Take 1 tablet by mouth daily.    . Cholecalciferol (VITAMIN D3) 5000 units TABS Take 5,000 Units by mouth daily. MWF     . CINNAMON PO Take 2 tablets by mouth every morning.    . dicyclomine (BENTYL) 20 MG tablet Take 20 mg by mouth 4 (four) times daily.    . diphenhydrAMINE (BENADRYL) 25 MG tablet Take 25 mg by mouth as needed for allergies.     . Fremanezumab-vfrm (AJOVY) 225 MG/1.5ML SOSY Inject 225 mg into the skin every 30 (thirty) days. 1 Syringe 11  . gabapentin (NEURONTIN) 600 MG tablet Take 3 capsules in AM and 2 capsules at bedtime 150 tablet 2  . Magnesium 500 MG TABS Take 500 mg by mouth daily.     . Melatonin 3 MG TABS Take 6 mg by mouth daily as needed (sleep).     . metFORMIN (GLUCOPHAGE) 1000 MG tablet Take 1,000 mg by mouth daily.     . mometasone-formoterol (DULERA) 100-5 MCG/ACT AERO Inhale 2 puffs into the lungs as needed for wheezing or shortness of breath.    . nitrofurantoin, macrocrystal-monohydrate, (MACROBID) 100 MG capsule Take 1 capsule (100 mg total) by mouth 2 (two) times daily. 14 capsule 0  . Omega-3 Fatty Acids (FISH OIL) 1200 MG CAPS Take 1,200 mg by mouth daily.     Marland Kitchen omeprazole (PRILOSEC) 40 MG capsule Take 40 mg by mouth daily.     . rizatriptan (MAXALT) 10 MG tablet Take 1 tablet earliest onset of migraine.  May repeat once in 2 hours if needed.  Do not exceed 2 tablets in 24 hours 10 tablet 2  . Turmeric 500 MG TABS Take 500 mg by mouth 2 (two) times daily.     . valACYclovir (VALTREX) 1000 MG tablet Take 1 tablet (1,000 mg total) by mouth 3 (three) times daily. 30 tablet 9   No current facility-administered medications on file prior to visit.     ALLERGIES: Allergies  Allergen Reactions  . Iohexol Anaphylaxis    Pt states she  received dye at [place] [facility] and developed airway/ tongue swelling   . Other Anaphylaxis    IV contrast  . Acetaminophen Other (See Comments)    headaches  . Celebrex [Celecoxib]     Mood changes  . Chantix [Varenicline] Other (See Comments)    Mood changes  . Crestor [Rosuvastatin Calcium]     Muscle pain/hives  . Ibuprofen     Migraine  . Keflex [Cephalexin]     Nausea/yeast infection  . Lamictal [Lamotrigine]     unknown  . Lyrica [Pregabalin]   . Meloxicam     Stomach problems  . Metoclopramide Other (See Comments)    Mood changes  . Naproxen     Stomach pain  . Nsaids   . Prednisone Other (See Comments)    Causes sugar lvl to rise  . Risperidone And Related Swelling  . Voltaren [Diclofenac Sodium]     FAMILY HISTORY: Family History  Problem Relation Age of Onset  . Breast cancer Mother     SOCIAL HISTORY: Social  History   Socioeconomic History  . Marital status: Married    Spouse name: Not on file  . Number of children: Not on file  . Years of education: Not on file  . Highest education level: Not on file  Social Needs  . Financial resource strain: Not on file  . Food insecurity - worry: Not on file  . Food insecurity - inability: Not on file  . Transportation needs - medical: Not on file  . Transportation needs - non-medical: Not on file  Occupational History  . Not on file  Tobacco Use  . Smoking status: Current Some Day Smoker  . Smokeless tobacco: Never Used  Substance and Sexual Activity  . Alcohol use: Yes  . Drug use: Yes    Types: Marijuana    Comment: pain relief  . Sexual activity: Yes    Partners: Male  Other Topics Concern  . Not on file  Social History Narrative  . Not on file    REVIEW OF SYSTEMS: Constitutional: No fevers, chills, or sweats, no generalized fatigue, change in appetite Eyes: No visual changes, double vision, eye pain Ear, nose and throat: No hearing loss, ear pain, nasal congestion, sore  throat Cardiovascular: No chest pain, palpitations Respiratory:  No shortness of breath at rest or with exertion, wheezes GastrointestinaI: No nausea, vomiting, diarrhea, abdominal pain, fecal incontinence Genitourinary:  No dysuria, urinary retention or frequency Musculoskeletal:  No neck pain, back pain Integumentary: No rash, pruritus, skin lesions Neurological: as above Psychiatric: depression, insomnia, anxiety Endocrine: No palpitations, fatigue, diaphoresis, mood swings, change in appetite, change in weight, increased thirst Hematologic/Lymphatic:  No purpura, petechiae. Allergic/Immunologic: no itchy/runny eyes, nasal congestion, recent allergic reactions, rashes  PHYSICAL EXAM: Vitals:   12/11/17 0923  Pulse: 72  Resp: 16  Unable to assess BP because her zipper was stuck and could not take off her jacket. General: No acute distress.  Patient appears well-groomed.  Head:  Normocephalic/atraumatic Eyes:  Fundi examined but not visualized Neck: supple, no paraspinal tenderness, full range of motion Heart:  Regular rate and rhythm Lungs:  Clear to auscultation bilaterally Back: No paraspinal tenderness Neurological Exam: alert and oriented to person, place, and time. Attention span and concentration intact, recent and remote memory intact, fund of knowledge intact.  Speech fluent and not dysarthric, language intact.  CN II-XII intact. Bulk and tone normal, muscle strength 5/5 throughout.  Sensation to light touch  intact.  Deep tendon reflexes 2+ throughout.  Finger to nose testing intact.  Gait normal, Romberg negative.  IMPRESSION: Migraine without aura Tobacco use disorder  PLAN: She would like to try nonpharmacologic management at this time. - Advised to discontinue coffee/caffeine - Monitor diet and possible food triggers - Sleep hygiene - Exercise - Headache diary - Limit use of pain relievers to no more than 2 days out of week - Tobacco cessation counseling (CPT  99406):  Tobacco Use: No history of CAD, stroke, or cancer  - Currently smoking 2 packs/day   - Patient was informed of the dangers of tobacco abuse including stroke, cancer, and MI, as well as benefits of tobacco cessation. - Patient is willing to quit at this time. - Approximately 5 mins were spent counseling patient cessation techniques. We discussed various methods to help quit smoking, including deciding on a date to quit, joining a support group, pharmacological agents- nicotine gum/patch/lozenges, chantix.  - I will reassess her progress at the next follow-up visit  Follow up in 3 months.  25  minutes spent face to face with patient, over 50% spent discussing management.   Metta Clines, DO  CC:  Dr. Laurann Montana

## 2017-12-22 ENCOUNTER — Encounter: Payer: Self-pay | Admitting: Physical Therapy

## 2017-12-22 ENCOUNTER — Ambulatory Visit: Payer: 59 | Attending: Obstetrics and Gynecology | Admitting: Physical Therapy

## 2017-12-22 DIAGNOSIS — M6281 Muscle weakness (generalized): Secondary | ICD-10-CM | POA: Diagnosis present

## 2017-12-22 DIAGNOSIS — R279 Unspecified lack of coordination: Secondary | ICD-10-CM | POA: Diagnosis present

## 2017-12-22 DIAGNOSIS — M62838 Other muscle spasm: Secondary | ICD-10-CM | POA: Diagnosis present

## 2017-12-22 NOTE — Patient Instructions (Signed)
  Lubrication . Used for intercourse to reduce friction . Avoid ones that have glycerin, warming gels, tingling gels, icing or cooling gel, scented . Avoid parabens due to a preservative similar to female sex hormone . May need to be reapplied once or several times during sexual activity . Can be applied to both partners genitals prior to vaginal penetration to minimize friction or irritation . Prevent irritation and mucosal tears that cause post coital pain and increased the risk of vaginal and urinary tract infections . Oil-based lubricants cannot be used with condoms due to breaking them down.  Least likely to irritate vaginal tissue.  . Plant based-lubes are safe . Silicone-based lubrication are thicker and last long and used for post-menopausal women  Vaginal Lubricators Here is a list of some suggested lubricators you can use for intercourse. Use the most hypoallergenic product.  You can place on you or your partner.   Slippery Stuff  Sylk or Sliquid Natural H2O ( good  if frequent UTI's)  Blossom Organics (www.blossom-organics.com)  Luvena   Coconut oil  PJur Woman Nude- water based lubricant, amazon  Uberlube- Amazon  Aloe Vera  Yes lubricant- Amazon  Wet Platinum-Silicone, Target, [facility]  Olive and Bee intimate cream-  www.oliveandbee.com.au Things to avoid in lubricants are glycerin, warming gels, tingling gels, icing or cooling  gels, and scented gels.  Also avoid Vaseline. KY jelly, Replens, and Astroglide kills good bacteria(lactobacilli)  Things to avoid in the vaginal area . Do not use things to irritate the vulvar area . No lotions- see below . No soaps; can use Aveeno or Calendula cleanser if needed. Must be gentle . No deodorants . No douches . Good to sleep without underwear to let the vaginal area to air out . No scrubbing: spread the lips to let warm water rinse over labias and pat dry  Creams that can be used on the Vulva Area  V  magic-amazon  Vital V Wild Yam Salve  Julva- Amazon  MoonMaid Botanical Pro-Meno Wild Yam Cream  Desert Havest Releveum or Desert Harvest Gele    [facility] [address], [address] [place], [place] [address] Phone # [phone] Fax [phone]  

## 2017-12-22 NOTE — Therapy (Signed)
[facility] [address], [place], [place], [address] Phone: [phone]   Fax:  [phone]  Physical Therapy Treatment  Patient Details  Name: Stacy Guzman MRN: [id number] Date of Birth: [birth year] Referring Provider: Dr. Thurnell Lose   Encounter Date: 12/22/2017  PT End of Session - 12/22/17 0912    Visit Number  12    Date for PT Re-Evaluation  01/06/18    Authorization Type  [facility]; [employer] medicare    PT Start Time  0910    PT Stop Time  0950    PT Time Calculation (min)  40 min    Activity Tolerance  Patient tolerated treatment well    Behavior During Therapy  [facility] for tasks assessed/performed       Past Medical History:  Diagnosis Date  . Arthritis   . Bipolar 1 disorder ([place])   . Colon polyp   . Diabetes mellitus without complication ([place])   . GERD (gastroesophageal reflux disease)   . Hypercholesteremia   . Hypertension   . IBS (irritable bowel syndrome)   . Kidney stone   . Lung nodule   . Melanoma ([place])   . Migraine   . PTSD (post-traumatic stress disorder)   . Sleep apnea     Past Surgical History:  Procedure Laterality Date  . DILATION AND CURETTAGE OF UTERUS    . KNEE ARTHROSCOPY    . THROAT SURGERY    . TUBAL LIGATION  12/03/2015    There were no vitals filed for this visit.  Subjective Assessment - 12/22/17 0909    Subjective  I am not feeling good in the stomach region.  Intercourse will sometimes bother me. Intercourse is 80% better. I may have the days of cramping and achy feeling. I have not been doing my exercises. I fell in the kitchen due to dizzy feeling.     Pertinent History  Lt knee replacement, Rt knee injury, migrains, type 2 diabetes, hyperlipidemia, depression, anxiety, Bipolar disorder, hypertension, IBS, fibromyalgia, lumbar spondylosis, sleep apnea and PTSD    Limitations  Other (comment) intercourse    Patient Stated Goals  get rid of severe cramping and bloating;  stop feeling dizzy    Currently in Pain?  Yes    Pain Score  6     Pain Location  Abdomen    Pain Orientation  Lower    Pain Descriptors / Indicators  Sore    Pain Type  Chronic pain    Pain Onset  More than a month ago    Pain Frequency  Constant    Aggravating Factors   stress    Pain Relieving Factors  massage    Multiple Pain Sites  No                      [facility] Adult PT Treatment/Exercise - 12/22/17 0001      Self-Care   Self-Care  Other Self-Care Comments    Other Self-Care Comments   information on use lubricants beside cocnut oil to see if it helps with the vaginal dryness      Lumbar Exercises: Stretches   Active Hamstring Stretch  Right;Left;2 reps;30 seconds    Piriformis Stretch  Right;Left;2 reps;30 seconds sitting    Other Lumbar Stretch Exercise  pelvic rock in sitting      Manual Therapy   Manual Therapy  Soft tissue mobilization;Myofascial release    Soft tissue mobilization  abdominal muscles  Myofascial Release  lower abdominal area to reduce pain and fascial release               PT Short Term Goals - 10/15/17 0934      PT SHORT TERM GOAL #1   Title  Patient will be independent with HEP (Target for all STGs 10/17/16)    Time  4    Period  Weeks    Status  Achieved      PT SHORT TERM GOAL #2   Title  Patient will complete SOT and STG/LTG to be set as appropriate.     Baseline  10/16/17 deferred    Time  4    Period  Weeks    Status  Deferred      PT SHORT TERM GOAL #3   Title  Complete FGA +/- Berg for standardized balance assessment and STG/LTGs set as appropriate    Baseline  10/15/17 24/30    Time  4    Period  Weeks    Status  Achieved      PT SHORT TERM GOAL #4   Title  Patient will complete DHi and LTG set as appropriate.    Baseline  09/30/17  42/100    Time  4    Period  Weeks    Status  Achieved      PT SHORT TERM GOAL #5   Title  DHI score will decrease to <=30 to demonstrate progress and move from moderate  handicap category to mild.    Baseline  09/30/17  42%    Time  4    Period  Weeks    Status  On-going        PT Long Term Goals - 12/09/17 1014      PT LONG TERM GOAL #5   Title  pt will be able to relax pelvic floor in order to pass stool, urine, or gas without straining or bearing down    Baseline  urination 50% better; urinary leakage is 50% better; bowel movement 80% better    Time  8    Status  On-going      PT LONG TERM GOAL #6   Time  8    Period  Weeks    Status  Achieved            Plan - 12/22/17 0933    Clinical Impression Statement  Patient is under stress and reports she is depressed.  Suggested she talk to someone.  When patient is stressed it will increase her pain level in the abdominal area and she is aware of it.  Patient reports intercourse is 80% better. Suggested to patient to use other lubricants with intercourse.  Patient does not have to strain  with urination and bowel movement. Patient will benefit from skilled therapy to progress HEP.     Rehab Potential  Good    Clinical Impairments Affecting Rehab Potential  bilateral knee pain, fibromyalgia, IBS,     PT Frequency  1x / week    PT Duration  8 weeks    PT Treatment/Interventions  ADLs/Self Care Home Management;Biofeedback;Neuromuscular re-education;Patient/family education;Therapeutic exercise;Therapeutic activities;Electrical Stimulation;Cryotherapy;Moist Heat;Manual techniques;Dry needling    PT Next Visit Plan  prepare for discharge.     PT Home Exercise Plan  progress as needed    Recommended Other Services  second request to have renewal signed    Consulted and Agree with Plan of Care  Patient       Patient  will benefit from skilled therapeutic intervention in order to improve the following deficits and impairments:  Postural dysfunction, Pain, Impaired tone, Increased muscle spasms, Decreased strength  Visit Diagnosis: Other muscle spasm  Muscle weakness (generalized)  Unspecified  lack of coordination     Problem List Patient Active Problem List   Diagnosis Date Noted  . Marijuana use, continuous 03/19/2017    Earlie Counts, PT 12/22/17 9:49 AM   [facility] [address] [place], [place], [address] Phone: [phone]   Fax:  [phone]  Name: DANAH REINECKE MRN: [id number] Date of Birth: 12/25/1970

## 2018-01-05 ENCOUNTER — Encounter: Payer: Self-pay | Admitting: Physical Therapy

## 2018-01-05 ENCOUNTER — Ambulatory Visit: Payer: 59 | Admitting: Physical Therapy

## 2018-01-05 DIAGNOSIS — M62838 Other muscle spasm: Secondary | ICD-10-CM

## 2018-01-05 DIAGNOSIS — M6281 Muscle weakness (generalized): Secondary | ICD-10-CM

## 2018-01-05 NOTE — Therapy (Signed)
[facility] [address] [place], [place], [address] Phone: [phone]   Fax:  [phone]  Physical Therapy Treatment  Patient Details  Name: Stacy Guzman MRN: [id number] Date of Birth: [birth year] Referring Provider: Dr. Thurnell Lose   Encounter Date: 01/05/2018  PT End of Session - 01/05/18 0935    Visit Number  13    Date for PT Re-Evaluation  01/06/18    Authorization Type  [employer]; [employer] medicare    PT Start Time  0930    PT Stop Time  1008    PT Time Calculation (min)  38 min    Activity Tolerance  Patient tolerated treatment well    Behavior During Therapy  [facility] for tasks assessed/performed       Past Medical History:  Diagnosis Date  . Arthritis   . Bipolar 1 disorder ([place])   . Colon polyp   . Diabetes mellitus without complication ([place])   . GERD (gastroesophageal reflux disease)   . Hypercholesteremia   . Hypertension   . IBS (irritable bowel syndrome)   . Kidney stone   . Lung nodule   . Melanoma ([place])   . Migraine   . PTSD (post-traumatic stress disorder)   . Sleep apnea     Past Surgical History:  Procedure Laterality Date  . DILATION AND CURETTAGE OF UTERUS    . KNEE ARTHROSCOPY    . THROAT SURGERY    . TUBAL LIGATION  12/03/2015    There were no vitals filed for this visit.  Subjective Assessment - 01/05/18 0939    Subjective  no bloating of the abdominal.  Urinary leakage is better.  I have cramps in the lower abdominal area, rectal, and vaginal. I am able to massage and calm the cramps down. Leak urine with coughing.     Pertinent History  Lt knee replacement, Rt knee injury, migrains, type 2 diabetes, hyperlipidemia, depression, anxiety, Bipolar disorder, hypertension, IBS, fibromyalgia, lumbar spondylosis, sleep apnea and PTSD    Patient Stated Goals  get rid of severe cramping and bloating; stop feeling dizzy    Currently in Pain?  Yes    Pain Score  10-Worst pain ever    Pain  Location  Pelvis    Pain Orientation  Mid    Pain Descriptors / Indicators  Cramping    Pain Type  Chronic pain    Pain Onset  More than a month ago    Pain Frequency  Intermittent    Aggravating Factors   just comes on    Pain Relieving Factors  massage it and use heat    Multiple Pain Sites  No         [facility] PT Assessment - 01/05/18 0001      Assessment   Medical Diagnosis  N94.10 Unspecified Dyspareunia    Referring Provider  Dr. Thurnell Lose    Onset Date/Surgical Date  -- 2 years ago    Prior Therapy  none      Precautions   Precautions  Fall      Restrictions   Weight Bearing Restrictions  No      [place] residence      Prior Function   Level of Independence  Independent      Cognition   Overall Cognitive Status  Within Functional Limits for tasks assessed      Observation/Other Assessments   Focus on Therapeutic Outcomes (FOTO)  not captured      Posture/Postural Control   Posture/Postural Control  Postural limitations    Postural Limitations  Rounded Shoulders;Forward head      Strength   Overall Strength Comments  core weakness 3/5            No data recorded    Pelvic Floor Special Questions - 01/05/18 0001    Urinary Leakage  Yes    Pad use  1just in case    Activities that cause leaking  Coughing    Fecal incontinence  No    Strength  fair squeeze, definite lift        [facility] Adult PT Treatment/Exercise - 01/05/18 0001      Manual Therapy   Manual Therapy  Soft tissue mobilization;Myofascial release    Soft tissue mobilization  abdominal muscles; diaphgram; rectus, obliques, transverse abdominus    Myofascial Release  lower abdominal area to reduce pain and fascial release               PT Short Term Goals - 10/15/17 0934      PT SHORT TERM GOAL #1   Title  Patient will be independent with HEP (Target for all STGs 10/17/16)    Time  4    Period  Weeks    Status  Achieved       PT SHORT TERM GOAL #2   Title  Patient will complete SOT and STG/LTG to be set as appropriate.     Baseline  10/16/17 deferred    Time  4    Period  Weeks    Status  Deferred      PT SHORT TERM GOAL #3   Title  Complete FGA +/- Berg for standardized balance assessment and STG/LTGs set as appropriate    Baseline  10/15/17 24/30    Time  4    Period  Weeks    Status  Achieved      PT SHORT TERM GOAL #4   Title  Patient will complete DHi and LTG set as appropriate.    Baseline  09/30/17  42/100    Time  4    Period  Weeks    Status  Achieved      PT SHORT TERM GOAL #5   Title  DHI score will decrease to <=30 to demonstrate progress and move from moderate handicap category to mild.    Baseline  09/30/17  42%    Time  4    Period  Weeks    Status  On-going        PT Long Term Goals - 01/05/18 0936      PT LONG TERM GOAL #1   Title  Patient will be independent with updated HEP (Target for all LTGs 11/16/17)    Time  8    Period  Weeks    Status  Achieved      PT LONG TERM GOAL #5   Title  pt will be able to relax pelvic floor in order to pass stool, urine, or gas without straining or bearing down    Baseline  urination 90% better; urinary leakage is 50% better; bowel movement 80% better    Time  8    Period  Weeks    Status  Achieved            Plan - 01/05/18 1007    Clinical Impression Statement  Patient has met all of her goals.  Patient reports she will  only leak urine when she coughs. Patient has muscles spasms in the pelvic floor muscles but she is able to manage it with soft tissue work and feels better.  Patient reports no difficulty with urinating.  Patient reports bowel movements are 80% easier.  Patient has no pain with intercourse.  Patient is using lubrication with intercourse. Patient agrees with discharge.     Rehab Potential  Good    Clinical Impairments Affecting Rehab Potential  bilateral knee pain, fibromyalgia, IBS,     PT Treatment/Interventions   ADLs/Self Care Home Management;Biofeedback;Neuromuscular re-education;Patient/family education;Therapeutic exercise;Therapeutic activities;Electrical Stimulation;Cryotherapy;Moist Heat;Manual techniques;Dry needling    PT Next Visit Plan  Discharge to HEP this visit    PT Home Exercise Plan  Current HEP    Recommended Other Services  third request on 01/05/2018    Consulted and Agree with Plan of Care  Patient       Patient will benefit from skilled therapeutic intervention in order to improve the following deficits and impairments:  Postural dysfunction, Pain, Impaired tone, Increased muscle spasms, Decreased strength  Visit Diagnosis: Other muscle spasm  Muscle weakness (generalized)     Problem List Patient Active Problem List   Diagnosis Date Noted  . Marijuana use, continuous 03/19/2017    Earlie Counts, PT 01/05/18 10:14 AM   [facility] [address], [place], [place], [address] Phone: [phone]   Fax:  [phone]  Name: Stacy Guzman MRN: [id number] Date of Birth: September 04, 1971  PHYSICAL THERAPY DISCHARGE SUMMARY  Visits from Start of Care: 13  Current functional level related to goals / functional outcomes: See above.    Remaining deficits: See above.    Education / Equipment: HEP Plan: Patient agrees to discharge.  Patient goals were met. Patient is being discharged due to meeting the stated rehab goals.  Thank you for the referral Earlie Counts, PT 01/05/18 10:14 AM  ?????

## 2018-01-22 ENCOUNTER — Encounter: Payer: Self-pay | Admitting: Podiatry

## 2018-01-22 ENCOUNTER — Ambulatory Visit (INDEPENDENT_AMBULATORY_CARE_PROVIDER_SITE_OTHER): Payer: 59 | Admitting: Podiatry

## 2018-01-22 ENCOUNTER — Ambulatory Visit (INDEPENDENT_AMBULATORY_CARE_PROVIDER_SITE_OTHER): Payer: 59

## 2018-01-22 ENCOUNTER — Other Ambulatory Visit: Payer: Self-pay | Admitting: Podiatry

## 2018-01-22 VITALS — BP 181/91 | HR 91

## 2018-01-22 DIAGNOSIS — M779 Enthesopathy, unspecified: Secondary | ICD-10-CM

## 2018-01-22 DIAGNOSIS — E1142 Type 2 diabetes mellitus with diabetic polyneuropathy: Secondary | ICD-10-CM

## 2018-01-22 DIAGNOSIS — M205X2 Other deformities of toe(s) (acquired), left foot: Secondary | ICD-10-CM

## 2018-01-22 DIAGNOSIS — M2022 Hallux rigidus, left foot: Secondary | ICD-10-CM

## 2018-01-22 DIAGNOSIS — M205X1 Other deformities of toe(s) (acquired), right foot: Secondary | ICD-10-CM

## 2018-01-22 DIAGNOSIS — R52 Pain, unspecified: Secondary | ICD-10-CM

## 2018-01-22 DIAGNOSIS — M2021 Hallux rigidus, right foot: Secondary | ICD-10-CM

## 2018-01-22 DIAGNOSIS — M722 Plantar fascial fibromatosis: Secondary | ICD-10-CM | POA: Diagnosis not present

## 2018-01-22 NOTE — Progress Notes (Signed)
This patient presents to the office with chief complaint of painful feet.  She says she experiences pain on the bottom of her foot and on the bottom of her forefeet.  She says there is a buildup of fluid under the balls of both feet.  This patient gives a history of fibromyalgia and back pain She relates having pain up her leg into her knee and then into her hip.  She says that she is diabetic and she requests a diabetic footgear.she presents the office today for an evaluation and treatment of her painful diabetic feet. She states she is diabetic with neuropathy.  General Appearance  Alert, conversant and in no acute stress.  Vascular  Dorsalis pedis and posterior tibial  pulses are palpable  bilaterally.  Capillary return is within normal limits  bilaterally. Temperature is within normal limits  bilaterally.  Neurologic  Senn-Weinstein monofilament wire test  diminished/absent   bilaterally. Muscle power within normal limits bilaterally.  Nails Thick disfigured discolored nails with subungual debris  from hallux to fifth toes bilaterally. No evidence of bacterial infection or drainage bilaterally.  Orthopedic  No limitations of motion of motion feet .  No crepitus or effusions noted.  Functional hallux limitus  B/L. Palpable pain upon palpation metatarsal heads  B/L  Plantar fasciitis  B/L  Skin  normotropic skin with no porokeratosis noted bilaterally.  No signs of infections or ulcers noted     Onychomycosis  B/L  Plantar fascitis  B/L  Capsulitis  MPJ  B/L.    Diabetes with neuropathy.    IE  Debridement of nails  X 10.  X-rays taken reveal no evidence of bony pathology.  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  I feel that may explain why she has pain in the forefeet.  Patient does qualify for diabetic shoes due to  DPN plantar fascitis and hallux limitus  B/L.  He was told to make an appointment with [facility]..   Discussed possible orthoses and recommended three-quarter orthotics from [employer].  RTC prn  Gardiner Barefoot DPM

## 2018-01-28 ENCOUNTER — Ambulatory Visit: Payer: 59 | Admitting: Orthotics

## 2018-01-28 DIAGNOSIS — M2022 Hallux rigidus, left foot: Principal | ICD-10-CM

## 2018-01-28 DIAGNOSIS — M2021 Hallux rigidus, right foot: Secondary | ICD-10-CM

## 2018-01-28 DIAGNOSIS — E1142 Type 2 diabetes mellitus with diabetic polyneuropathy: Secondary | ICD-10-CM

## 2018-01-28 DIAGNOSIS — M205X1 Other deformities of toe(s) (acquired), right foot: Secondary | ICD-10-CM

## 2018-01-28 DIAGNOSIS — M205X2 Other deformities of toe(s) (acquired), left foot: Secondary | ICD-10-CM

## 2018-01-28 NOTE — Progress Notes (Signed)
Patient came in today for fitting measurement for DM2.  Patient prsents with DM2, PN, [facility] b/l.  Patient PCP is Kelton Pillar.  Prudence Davidson is DPM. Measured [address] on [place],  Chose apex [id number]

## 2018-01-29 ENCOUNTER — Ambulatory Visit (HOSPITAL_COMMUNITY)
Admission: EM | Admit: 2018-01-29 | Discharge: 2018-01-29 | Disposition: A | Discharge status: Home / Self Care | Payer: 59 | Attending: Family Medicine | Admitting: Family Medicine

## 2018-01-29 ENCOUNTER — Encounter (HOSPITAL_COMMUNITY): Payer: Self-pay

## 2018-01-29 ENCOUNTER — Other Ambulatory Visit: Payer: Self-pay

## 2018-01-29 DIAGNOSIS — R739 Hyperglycemia, unspecified: Secondary | ICD-10-CM | POA: Diagnosis not present

## 2018-01-29 LAB — POCT I-STAT, CHEM 8
BUN: 9 mg/dL (ref 6–20)
Calcium, Ion: 1.15 mmol/L (ref 1.15–1.40)
Chloride: 103 mmol/L (ref 101–111)
Creatinine, Ser: 0.5 mg/dL (ref 0.44–1.00)
Glucose, Bld: 89 mg/dL (ref 65–99)
HCT: 48 % — ABNORMAL HIGH (ref 36.0–46.0)
Hemoglobin: 16.3 g/dL — ABNORMAL HIGH (ref 12.0–15.0)
Potassium: 3.9 mmol/L (ref 3.5–5.1)
Sodium: 138 mmol/L (ref 135–145)
TCO2: 24 mmol/L (ref 22–32)

## 2018-01-29 NOTE — ED Provider Notes (Signed)
[facility]    CSN: [id number] Arrival date & time: 01/29/18  1019     History   Chief Complaint Chief Complaint  Patient presents with  . Hyperglycemia    HPI Stacy Guzman is a 47 y.o. female.   Patient has been on prednisone for rheumatologic symptoms.  She has a history of diabetes and takes metformin and glipizide but intermittently.  This morning her sugar was 390.  She took her medicines and here in the office is 109.  HPI  Past Medical History:  Diagnosis Date  . Arthritis   . Bipolar 1 disorder ([place])   . Colon polyp   . Diabetes mellitus without complication ([place])   . GERD (gastroesophageal reflux disease)   . Hypercholesteremia   . Hypertension   . IBS (irritable bowel syndrome)   . Kidney stone   . Lung nodule   . Melanoma ([place])   . Migraine   . PTSD (post-traumatic stress disorder)   . Sleep apnea     Patient Active Problem List   Diagnosis Date Noted  . Marijuana use, continuous 03/19/2017    Past Surgical History:  Procedure Laterality Date  . DILATION AND CURETTAGE OF UTERUS    . KNEE ARTHROSCOPY    . THROAT SURGERY    . TUBAL LIGATION  12/03/2015    OB History   None      Home Medications    Prior to Admission medications   Medication Sig Start Date End Date Taking? Authorizing Provider  aspirin EC 81 MG tablet Take 81 mg by mouth daily.    [provider]  atenolol (TENORMIN) 50 MG tablet Take 1 tablet (50 mg total) by mouth daily. 11/20/17   Pieter Partridge, DO  b complex vitamins tablet Take 1 tablet by mouth daily.    [provider]  Cholecalciferol (VITAMIN D3) 5000 units TABS Take 5,000 Units by mouth daily. MWF     [provider]  CINNAMON PO Take 2 tablets by mouth every morning.    [provider]  dicyclomine (BENTYL) 20 MG tablet Take 20 mg by mouth 4 (four) times daily.    [provider]  diphenhydrAMINE (BENADRYL) 25 MG tablet Take 25 mg by mouth as needed for  allergies.     [provider]  Fremanezumab-vfrm (AJOVY) 225 MG/1.5ML SOSY Inject 225 mg into the skin every 30 (thirty) days. 09/15/17   Pieter Partridge, DO  gabapentin (NEURONTIN) 600 MG tablet Take 3 capsules in AM and 2 capsules at bedtime 05/14/17   Pieter Partridge, DO  Magnesium 500 MG TABS Take 500 mg by mouth daily.     [provider]  Melatonin 3 MG TABS Take 6 mg by mouth daily as needed (sleep).     [provider]  metFORMIN (GLUCOPHAGE) 1000 MG tablet Take 1,000 mg by mouth daily.     [provider]  mometasone-formoterol (DULERA) 100-5 MCG/ACT AERO Inhale 2 puffs into the lungs as needed for wheezing or shortness of breath.    [provider]  nitrofurantoin, macrocrystal-monohydrate, (MACROBID) 100 MG capsule Take 1 capsule (100 mg total) by mouth 2 (two) times daily. 04/03/17   Margarita Mail, PA-C  Omega-3 Fatty Acids (FISH OIL) 1200 MG CAPS Take 1,200 mg by mouth daily.     [provider]  omeprazole (PRILOSEC) 40 MG capsule Take 40 mg by mouth daily.     [provider]  rizatriptan (MAXALT) 10  MG tablet Take 1 tablet earliest onset of migraine.  May repeat once in 2 hours if needed.  Do not exceed 2 tablets in 24 hours 05/14/17   Pieter Partridge, DO  Turmeric 500 MG TABS Take 500 mg by mouth 2 (two) times daily.     [provider]  valACYclovir (VALTREX) 1000 MG tablet Take 1 tablet (1,000 mg total) by mouth 3 (three) times daily. 04/03/17   Margarita Mail, PA-C    Family History Family History  Problem Relation Age of Onset  . Breast cancer Mother     Social History Social History   Tobacco Use  . Smoking status: Current Some Day Smoker  . Smokeless tobacco: Never Used  Substance Use Topics  . Alcohol use: Yes  . Drug use: Yes    Types: Marijuana    Comment: pain relief     Allergies   Iohexol; Other; Acetaminophen; Celebrex [celecoxib]; Chantix [varenicline]; Crestor [rosuvastatin calcium];  Ibuprofen; Keflex [cephalexin]; Lamictal [lamotrigine]; Lyrica [pregabalin]; Meloxicam; Metoclopramide; Naproxen; Nsaids; Prednisone; Risperidone and related; and Voltaren [diclofenac sodium]   Review of Systems Review of Systems  Constitutional: Negative.   Respiratory: Negative.   Cardiovascular: Negative.   Musculoskeletal: Positive for arthralgias.  Neurological: Negative.      Physical Exam Triage Vital Signs ED Triage Vitals  Enc Vitals Group     BP 01/29/18 1128 (!) 143/78     Pulse Rate 01/29/18 1128 80     Resp 01/29/18 1128 18     Temp 01/29/18 1128 98.2 F (36.8 C)     Temp src --      SpO2 01/29/18 1128 100 %     Weight --      Height --      Head Circumference --      Peak Flow --      Pain Score 01/29/18 1126 0     Pain Loc --      Pain Edu? --      Excl. in [place]? --    No data found.  Updated Vital Signs BP (!) 143/78   Pulse 80   Temp 98.2 F (36.8 C)   Resp 18   SpO2 100%   Visual Acuity Right Eye Distance:   Left Eye Distance:   Bilateral Distance:    Right Eye Near:   Left Eye Near:    Bilateral Near:     Physical Exam  Constitutional: She is oriented to person, place, and time. She appears well-developed and well-nourished.  Cardiovascular: Normal rate and regular rhythm.  Pulmonary/Chest: Effort normal and breath sounds normal.  Abdominal: Soft.  Neurological: She is alert and oriented to person, place, and time.     UC Treatments / Results  Labs (all labs ordered are listed, but only abnormal results are displayed) Labs Reviewed  POCT I-STAT, CHEM 8 - Abnormal; Notable for the following components:      Result Value   Hemoglobin 16.3 (*)    HCT 48.0 (*)    All other components within normal limits  I-STAT CHEM 8, ED    EKG None Radiology No results found.  Procedures Procedures (including critical care time)  Medications Ordered in UC Medications - No data to display   Initial Impression / Assessment and Plan /  UC Course  I have reviewed the triage vital signs and the nursing notes.  Pertinent labs & imaging results that were available during my care of the patient were reviewed by me and  considered in my medical decision making (see chart for details).     Hyperglycemia related to use of steroid and diabetic.  I have recommended she continue with her metformin and glipizide and continue to monitor her sugar  Final Clinical Impressions(s) / UC Diagnoses   Final diagnoses:  None    ED Discharge Orders    None       Controlled Substance Prescriptions [place] Controlled Substance Registry consulted? No   Wardell Honour, MD 01/29/18 [phone]

## 2018-01-29 NOTE — ED Triage Notes (Signed)
Pt presents with complaints of taking prednisone and making her blood sugar elevated.  Reports her blood sugar being 390 this morning.

## 2018-02-08 ENCOUNTER — Encounter: Payer: Self-pay | Admitting: Neurology

## 2018-03-02 ENCOUNTER — Ambulatory Visit (INDEPENDENT_AMBULATORY_CARE_PROVIDER_SITE_OTHER): Payer: 59 | Admitting: Orthotics

## 2018-03-02 DIAGNOSIS — M205X1 Other deformities of toe(s) (acquired), right foot: Secondary | ICD-10-CM | POA: Diagnosis not present

## 2018-03-02 DIAGNOSIS — M2021 Hallux rigidus, right foot: Principal | ICD-10-CM

## 2018-03-02 DIAGNOSIS — E1142 Type 2 diabetes mellitus with diabetic polyneuropathy: Secondary | ICD-10-CM

## 2018-03-02 DIAGNOSIS — M205X2 Other deformities of toe(s) (acquired), left foot: Secondary | ICD-10-CM

## 2018-03-02 DIAGNOSIS — M2022 Hallux rigidus, left foot: Secondary | ICD-10-CM

## 2018-03-02 NOTE — Addendum Note (Signed)
Addended by: Velora Heckler on: 03/02/2018 10:10 AM   Modules accepted: Level of Service

## 2018-03-02 NOTE — Progress Notes (Signed)

## 2018-03-25 ENCOUNTER — Ambulatory Visit (INDEPENDENT_AMBULATORY_CARE_PROVIDER_SITE_OTHER): Payer: 59 | Admitting: Neurology

## 2018-03-25 ENCOUNTER — Encounter: Payer: Self-pay | Admitting: Neurology

## 2018-03-25 VITALS — BP 158/80 | HR 72 | Ht 62.5 in | Wt 184.0 lb

## 2018-03-25 DIAGNOSIS — G43009 Migraine without aura, not intractable, without status migrainosus: Secondary | ICD-10-CM

## 2018-03-25 NOTE — Progress Notes (Signed)
NEUROLOGY FOLLOW UP OFFICE NOTE  Stacy Guzman [id number]  HISTORY OF PRESENT ILLNESS: Stacy Guzman is a 47 year old female with type 2 diabetes, hyperlipidemia, depression, anxiety, Bipolar disorder, hypertension, IBS, fibromyalgia, lumbar spondylosis, sleep apnea and PTSD who follows up for migraine.   UPDATE: Last visit, she wished to try non-pharmacologic treatment.  In March, she got a Daith piercing in her right ear.  She has had remarkable improvement.  She has had only 3 migraines since March (moderate-severe, lasting 5 hours).  She treats with ice and/or hot shower. Current NSAIDS:  no Current analgesics:  no Current triptans:   Current anti-emetic:  no Current muscle relaxants:  no Current anti-anxiolytic:  no Current sleep aide:  no Current Antihypertensive medications:  metoprolol Current Antidepressant medications:  no Current Anticonvulsant medications:  no Current anti-CGRP:  no Current Vitamins/Herbal/Supplements:  no Current Antihistamines/Decongestants:  no Other therapy:  Daith piercing    Caffeine:  1 pot of coffee daily Alcohol:  no Smoker:  Yes (cigarettes and marijuana) Diet:  hydrates Exercise:  walks Depression/anxiety:  Yes.  Significant stressors over past year (divorce after over 31 years, her dad passed away, financial hardship).  She intentionally overdosed a year ago.  She has no suicidal ideation currently. Sleep hygiene:  Okay, however she sleep walks.  She sees a sleep specialist.   HISTORY:  Onset:  Since teenager Location:  Bi-occipital radiating to bifrontal region Quality:  Vice-like Initial Intensity:  "12"/10 Aura:  no Prodrome:  no Postdrome:  no Associated symptoms:  Photophobia, phonophobia.  No nausea or visual disturbance.  She has not had any new worse headache of her life, waking up from sleep Initial Duration:  5 hours (sometimes days) Initial Frequency:  10 days per month, worse during her cycle Triggers/exacerbating  factors:  Hormonal, chocolate, caffeine, stress Relieving factors:  rest Activity:  aggravates   Past NSAIDS:  All NSAIDS (ibuprofen, naproxen) make headache worse Past analgesics:  Tylenol and Excedrin make headache worse), tramadol Past abortive triptans:  Sumatriptan ([place]/tab), Relpax, Maxalt (ineffective)  Past muscle relaxants:  cyclobenzaprine Past anti-emetic:  no Past antihypertensive medications:  Atenolol 50mg  Past antidepressant medications:  Cymbalta, amitriptyline, Effexor (side effects) Past anticonvulsant medications:  topiramate (side effects), Depakote (maybe many years ago), gabapentin (abdominal pain) Past anti-CGRP:  Ajovy (effective but prefers no injections) Past vitamins/Herbal/Supplements:  no Past antihistamines/decongestants:  no Other past therapies:  Cervical or occipital ablations, Botox (worked but could not afford and does not like needles)   Family history of headache:  yes  Due to vertigo and worsening headache, MRI of brain without contrast was performed on 08/27/17, which was personally reviewed and was unremarkable.  She was sent for vestibular rehabilitation.  Assessment was negative for peripheral etiology.  She was also seen in PT for balance problems following knee surgery.  She endorsed sensation of something crawling in her ear.  She was evaluated by ENT.  Audiometric testing showed very mild symmetric prebycusis pattern, age related and not significant.  Tympanograms were normal.  PAST MEDICAL HISTORY: Past Medical History:  Diagnosis Date  . Arthritis   . Bipolar 1 disorder ([place])   . Colon polyp   . Diabetes mellitus without complication ([place])   . GERD (gastroesophageal reflux disease)   . Hypercholesteremia   . Hypertension   . IBS (irritable bowel syndrome)   . Kidney stone   . Lung nodule   . Melanoma ([place])   . Migraine   .  PTSD (post-traumatic stress disorder)   . Sleep apnea     MEDICATIONS: Current Outpatient Medications on  File Prior to Visit  Medication Sig Dispense Refill  . aspirin EC 81 MG tablet Take 81 mg by mouth daily.    Marland Kitchen atenolol (TENORMIN) 50 MG tablet Take 1 tablet (50 mg total) by mouth daily. (Patient not taking: Reported on 03/25/2018) 30 tablet 5  . b complex vitamins tablet Take 1 tablet by mouth daily.    . Cholecalciferol (VITAMIN D3) 5000 units TABS Take 5,000 Units by mouth daily. MWF     . CINNAMON PO Take 2 tablets by mouth every morning.    . dicyclomine (BENTYL) 20 MG tablet Take 20 mg by mouth 4 (four) times daily.    . diphenhydrAMINE (BENADRYL) 25 MG tablet Take 25 mg by mouth as needed for allergies.     . Fremanezumab-vfrm (AJOVY) 225 MG/1.5ML SOSY Inject 225 mg into the skin every 30 (thirty) days. (Patient not taking: Reported on 03/25/2018) 1 Syringe 11  . gabapentin (NEURONTIN) 600 MG tablet Take 3 capsules in AM and 2 capsules at bedtime (Patient not taking: Reported on 03/25/2018) 150 tablet 2  . Magnesium 500 MG TABS Take 500 mg by mouth daily.     . Melatonin 3 MG TABS Take 6 mg by mouth daily as needed (sleep).     . metFORMIN (GLUCOPHAGE) 1000 MG tablet Take 1,000 mg by mouth daily.     . metoprolol succinate (TOPROL-XL) 50 MG 24 hr tablet Take 50 mg by mouth daily.  1  . mometasone-formoterol (DULERA) 100-5 MCG/ACT AERO Inhale 2 puffs into the lungs as needed for wheezing or shortness of breath.    . nitrofurantoin, macrocrystal-monohydrate, (MACROBID) 100 MG capsule Take 1 capsule (100 mg total) by mouth 2 (two) times daily. (Patient not taking: Reported on 03/25/2018) 14 capsule 0  . Omega-3 Fatty Acids (FISH OIL) 1200 MG CAPS Take 1,200 mg by mouth daily.     Marland Kitchen omeprazole (PRILOSEC) 40 MG capsule Take 40 mg by mouth daily.     . rizatriptan (MAXALT) 10 MG tablet Take 1 tablet earliest onset of migraine.  May repeat once in 2 hours if needed.  Do not exceed 2 tablets in 24 hours (Patient not taking: Reported on 03/25/2018) 10 tablet 2  . Turmeric 500 MG TABS Take 500 mg by  mouth 2 (two) times daily.     . valACYclovir (VALTREX) 1000 MG tablet Take 1 tablet (1,000 mg total) by mouth 3 (three) times daily. (Patient not taking: Reported on 03/25/2018) 30 tablet 9   No current facility-administered medications on file prior to visit.     ALLERGIES: Allergies  Allergen Reactions  . Iohexol Anaphylaxis    Pt states she received dye at [facility] and developed airway/ tongue swelling   . Other Anaphylaxis    IV contrast  . Acetaminophen Other (See Comments)    headaches  . Celebrex [Celecoxib]     Mood changes  . Chantix [Varenicline] Other (See Comments)    Mood changes  . Crestor [Rosuvastatin Calcium]     Muscle pain/hives  . Ibuprofen     Migraine  . Keflex [Cephalexin]     Nausea/yeast infection  . Lamictal [Lamotrigine]     unknown  . Lyrica [Pregabalin]   . Meloxicam     Stomach problems  . Metoclopramide Other (See Comments)    Mood changes  . Naproxen     Stomach pain  .  Nsaids   . Prednisone Other (See Comments)    Causes sugar lvl to rise  . Risperidone And Related Swelling  . Voltaren [Diclofenac Sodium]     FAMILY HISTORY: Family History  Problem Relation Age of Onset  . Breast cancer Mother     SOCIAL HISTORY: Social History   Socioeconomic History  . Marital status: Married    Spouse name: Not on file  . Number of children: Not on file  . Years of education: Not on file  . Highest education level: Not on file  Occupational History  . Not on file  Social Needs  . Financial resource strain: Not on file  . Food insecurity:    Worry: Not on file    Inability: Not on file  . Transportation needs:    Medical: Not on file    Non-medical: Not on file  Tobacco Use  . Smoking status: Current Some Day Smoker  . Smokeless tobacco: Never Used  Substance and Sexual Activity  . Alcohol use: Yes  . Drug use: Yes    Types: Marijuana    Comment: pain relief  . Sexual activity: Yes    Partners: Male    Lifestyle  . Physical activity:    Days per week: Not on file    Minutes per session: Not on file  . Stress: Not on file  Relationships  . Social connections:    Talks on phone: Not on file    Gets together: Not on file    Attends religious service: Not on file    Active member of club or organization: Not on file    Attends meetings of clubs or organizations: Not on file    Relationship status: Not on file  . Intimate partner violence:    Fear of current or ex partner: Not on file    Emotionally abused: Not on file    Physically abused: Not on file    Forced sexual activity: Not on file  Other Topics Concern  . Not on file  Social History Narrative  . Not on file    REVIEW OF SYSTEMS: Constitutional: No fevers, chills, or sweats, no generalized fatigue, change in appetite Eyes: No visual changes, double vision, eye pain Ear, nose and throat: No hearing loss, ear pain, nasal congestion, sore throat Cardiovascular: No chest pain, palpitations Respiratory:  No shortness of breath at rest or with exertion, wheezes GastrointestinaI: No nausea, vomiting, diarrhea, abdominal pain, fecal incontinence Genitourinary:  No dysuria, urinary retention or frequency Musculoskeletal:  No neck pain, back pain Integumentary: No rash, pruritus, skin lesions Neurological: as above Psychiatric: No depression, insomnia, anxiety Endocrine: No palpitations, fatigue, diaphoresis, mood swings, change in appetite, change in weight, increased thirst Hematologic/Lymphatic:  No purpura, petechiae. Allergic/Immunologic: no itchy/runny eyes, nasal congestion, recent allergic reactions, rashes  PHYSICAL EXAM: Vitals:   03/25/18 0956  BP: (!) 158/80  Pulse: 72  SpO2: 97%   General: No acute distress.  Patient appears well-groomed.   IMPRESSION: Migraine without aura, not intractable Tobacco use disorder  PLAN: 1.  Continue current regimen  25 minutes spent face to face with patient, 100%  spent discussing management.   Metta Clines, DO  CC:  Dr. Laurann Montana

## 2018-03-29 ENCOUNTER — Ambulatory Visit: Payer: 59 | Admitting: Neurology

## 2018-09-07 ENCOUNTER — Other Ambulatory Visit: Payer: Self-pay | Admitting: Internal Medicine

## 2018-09-07 DIAGNOSIS — Z1231 Encounter for screening mammogram for malignant neoplasm of breast: Secondary | ICD-10-CM

## 2018-09-14 ENCOUNTER — Other Ambulatory Visit: Payer: Self-pay | Admitting: Family Medicine

## 2018-09-14 ENCOUNTER — Ambulatory Visit
Admission: RE | Admit: 2018-09-14 | Discharge: 2018-09-14 | Disposition: A | Discharge status: Home / Self Care | Payer: 59 | Source: Ambulatory Visit | Attending: Internal Medicine | Admitting: Internal Medicine

## 2018-09-14 DIAGNOSIS — Z1231 Encounter for screening mammogram for malignant neoplasm of breast: Secondary | ICD-10-CM

## 2018-09-26 NOTE — Progress Notes (Signed)
NEUROLOGY FOLLOW UP OFFICE NOTE  Stacy Guzman [id number]  HISTORY OF PRESENT ILLNESS: Stacy Guzman is a 47 year old female with type 2 diabetes, hyperlipidemia, hypertension, IBS, fibromyalgia, depression, anxiety and Bipolar disorder who follows up for migraines.  UPDATE: Intensity: Moderate to severe Duration: Up to 5 hours Frequency: 3 over the last 6 months. Current NSAIDS:  None Current analgesics: None Current triptans: None Current ergotamine: None Current anti-emetic: None Current muscle relaxants:  Flexeril 10mg  Current anti-anxiolytic: None Current sleep aide: None Current Antihypertensive medications:  metoprolol Current Antidepressant medications: None Current Anticonvulsant medications: None Current anti-CGRP: None Current Vitamins/Herbal/Supplements: None Current Antihistamines/Decongestants:  Benadryl as needed Other therapy:  Daith piercing  Caffeine:  1 pot of coffee daily Diet:  hydrates Exercise:  walks Depression:  yes; Anxiety:  yes.  She recently has increased stress due to possible recurrence of melanoma on her scalp. Other pain:  fibromyalgia Sleep hygiene:  okay  HISTORY: Onset:  Since teenager Location:  Bi-occipital radiating to bifrontal region Quality:  Vice-like Initial Intensity:  "12"/10 Aura:  no Prodrome:  no Postdrome:  no Associated symptoms:  photophobia, phonophobia.  No nausea or visual disturbance.  She has not had any new worse headache of her life, waking up from sleep Initial Duration:  5 hours (sometimes days) Initial Frequency:  10 days per month, worse during her cycle Triggers:  Hormonal, chocolate, caffeine, stress Relieving factors:  rest Activity:  aggravates  Past NSAIDS:  All NSAIDS (ibuprofen, naproxen) make headache worse Past analgesics:  Tylenol and Excedrin make headache worse), tramadol Past abortive triptans:  Sumatriptan ([place]/tab), Relpax, Maxalt (ineffective)  Past muscle relaxants:   cyclobenzaprine Past anti-emetic:  no Past antihypertensive medications:  Atenolol 50mg  Past antidepressant medications:  Cymbalta, amitriptyline, Effexor (side effects) Past anticonvulsant medications:  topiramate (side effects), Depakote (maybe many years ago), gabapentin (abdominal pain) Past anti-CGRP:  Ajovy (effective but prefers no injections) Past vitamins/Herbal/Supplements:  no Past antihistamines/decongestants:  no Other past therapies:  Cervical or occipital ablations, Botox (worked but could not afford and does not like needles)  Family history of headache:  yes  Due to vertigo and worsening headache, MRI of brain without contrast was performed on 08/27/17, which was personally reviewed and was unremarkable.  She was sent for vestibular rehabilitation.  Assessment was negative for peripheral etiology.  She was also seen in PT for balance problems following knee surgery.  She endorsed sensation of something crawling in her ear.  She was evaluated by ENT.  Audiometric testing showed very mild symmetric prebycusis pattern, age related and not significant.  Tympanograms were normal.  PAST MEDICAL HISTORY: Past Medical History:  Diagnosis Date  . Arthritis   . Bipolar 1 disorder ([place])   . Colon polyp   . Diabetes mellitus without complication ([place])   . GERD (gastroesophageal reflux disease)   . Hypercholesteremia   . Hypertension   . IBS (irritable bowel syndrome)   . Kidney stone   . Lung nodule   . Melanoma ([place])   . Migraine   . PTSD (post-traumatic stress disorder)   . Sleep apnea     MEDICATIONS: Current Outpatient Medications on File Prior to Visit  Medication Sig Dispense Refill  . aspirin EC 81 MG tablet Take 81 mg by mouth daily.    Marland Kitchen atenolol (TENORMIN) 50 MG tablet Take 1 tablet (50 mg total) by mouth daily. (Patient not taking: Reported on 03/25/2018) 30 tablet 5  . b complex vitamins tablet Take 1 tablet  by mouth daily.    . Cholecalciferol (VITAMIN D3)  5000 units TABS Take 5,000 Units by mouth daily. MWF     . CINNAMON PO Take 2 tablets by mouth every morning.    . dicyclomine (BENTYL) 20 MG tablet Take 20 mg by mouth 4 (four) times daily.    . diphenhydrAMINE (BENADRYL) 25 MG tablet Take 25 mg by mouth as needed for allergies.     . Fremanezumab-vfrm (AJOVY) 225 MG/1.5ML SOSY Inject 225 mg into the skin every 30 (thirty) days. (Patient not taking: Reported on 03/25/2018) 1 Syringe 11  . gabapentin (NEURONTIN) 600 MG tablet Take 3 capsules in AM and 2 capsules at bedtime (Patient not taking: Reported on 03/25/2018) 150 tablet 2  . Magnesium 500 MG TABS Take 500 mg by mouth daily.     . Melatonin 3 MG TABS Take 6 mg by mouth daily as needed (sleep).     . metFORMIN (GLUCOPHAGE) 1000 MG tablet Take 1,000 mg by mouth daily.     . metoprolol succinate (TOPROL-XL) 50 MG 24 hr tablet Take 50 mg by mouth daily.  1  . mometasone-formoterol (DULERA) 100-5 MCG/ACT AERO Inhale 2 puffs into the lungs as needed for wheezing or shortness of breath.    . nitrofurantoin, macrocrystal-monohydrate, (MACROBID) 100 MG capsule Take 1 capsule (100 mg total) by mouth 2 (two) times daily. (Patient not taking: Reported on 03/25/2018) 14 capsule 0  . Omega-3 Fatty Acids (FISH OIL) 1200 MG CAPS Take 1,200 mg by mouth daily.     Marland Kitchen omeprazole (PRILOSEC) 40 MG capsule Take 40 mg by mouth daily.     . rizatriptan (MAXALT) 10 MG tablet Take 1 tablet earliest onset of migraine.  May repeat once in 2 hours if needed.  Do not exceed 2 tablets in 24 hours (Patient not taking: Reported on 03/25/2018) 10 tablet 2  . Turmeric 500 MG TABS Take 500 mg by mouth 2 (two) times daily.     . valACYclovir (VALTREX) 1000 MG tablet Take 1 tablet (1,000 mg total) by mouth 3 (three) times daily. (Patient not taking: Reported on 03/25/2018) 30 tablet 9   No current facility-administered medications on file prior to visit.     ALLERGIES: Allergies  Allergen Reactions  . Iohexol Anaphylaxis    Pt  states she received dye at [facility] and developed airway/ tongue swelling   . Other Anaphylaxis    IV contrast  . Acetaminophen Other (See Comments)    headaches  . Celebrex [Celecoxib]     Mood changes  . Chantix [Varenicline] Other (See Comments)    Mood changes  . Crestor [Rosuvastatin Calcium]     Muscle pain/hives  . Ibuprofen     Migraine  . Keflex [Cephalexin]     Nausea/yeast infection  . Lamictal [Lamotrigine]     unknown  . Lyrica [Pregabalin]   . Meloxicam     Stomach problems  . Metoclopramide Other (See Comments)    Mood changes  . Naproxen     Stomach pain  . Nsaids   . Prednisone Other (See Comments)    Causes sugar lvl to rise  . Risperidone And Related Swelling  . Voltaren [Diclofenac Sodium]     FAMILY HISTORY: Family History  Problem Relation Age of Onset  . Breast cancer Mother     SOCIAL HISTORY: Social History   Socioeconomic History  . Marital status: Married    Spouse name: Not on file  . Number of  children: Not on file  . Years of education: Not on file  . Highest education level: Not on file  Occupational History  . Not on file  Social Needs  . Financial resource strain: Not on file  . Food insecurity:    Worry: Not on file    Inability: Not on file  . Transportation needs:    Medical: Not on file    Non-medical: Not on file  Tobacco Use  . Smoking status: Current Some Day Smoker  . Smokeless tobacco: Never Used  Substance and Sexual Activity  . Alcohol use: Yes  . Drug use: Yes    Types: Marijuana    Comment: pain relief  . Sexual activity: Yes    Partners: Male  Lifestyle  . Physical activity:    Days per week: Not on file    Minutes per session: Not on file  . Stress: Not on file  Relationships  . Social connections:    Talks on phone: Not on file    Gets together: Not on file    Attends religious service: Not on file    Active member of club or organization: Not on file    Attends meetings of  clubs or organizations: Not on file    Relationship status: Not on file  . Intimate partner violence:    Fear of current or ex partner: Not on file    Emotionally abused: Not on file    Physically abused: Not on file    Forced sexual activity: Not on file  Other Topics Concern  . Not on file  Social History Narrative  . Not on file    REVIEW OF SYSTEMS: Constitutional: No fevers, chills, or sweats, no generalized fatigue, change in appetite Eyes: No visual changes, double vision, eye pain Ear, nose and throat: No hearing loss, ear pain, nasal congestion, sore throat Cardiovascular: No chest pain, palpitations Respiratory:  No shortness of breath at rest or with exertion, wheezes GastrointestinaI: No nausea, vomiting, diarrhea, abdominal pain, fecal incontinence Genitourinary:  No dysuria, urinary retention or frequency Musculoskeletal:  No neck pain, back pain Integumentary: No rash, pruritus, skin lesions Neurological: as above Psychiatric: No depression, insomnia, anxiety Endocrine: No palpitations, fatigue, diaphoresis, mood swings, change in appetite, change in weight, increased thirst Hematologic/Lymphatic:  No purpura, petechiae. Allergic/Immunologic: no itchy/runny eyes, nasal congestion, recent allergic reactions, rashes  PHYSICAL EXAM: Blood pressure (!) 142/76, pulse 72, height 5' 2.5" (1.588 m), weight 188 lb (85.3 kg), SpO2 98 %. General: No acute distress.  Patient appears well-groomed.   Head:  Normocephalic/atraumatic Eyes:  Fundi examined but not visualized Neck: supple, no paraspinal tenderness, full range of motion Heart:  Regular rate and rhythm Lungs:  Clear to auscultation bilaterally Back: No paraspinal tenderness Neurological Exam: alert and oriented to person, place, and time. Attention span and concentration intact, recent and remote memory intact, fund of knowledge intact.  Speech fluent and not dysarthric, language intact.  CN II-XII intact. Bulk and  tone normal, muscle strength 5/5 throughout.  Sensation to light touch intact.  Deep tendon reflexes 2+ throughout.  Finger to nose testing intact.  Gait normal, Romberg negative.  IMPRESSION: Migraine without aura, without status migrainosus, not intractable  PLAN: At this point she will continue current regimen. She will follow-up in 6 months.  25 minutes spent face-to-face with the patient, over 50% spent discussing management.  Metta Clines, DO  CC: Kelton Pillar, MD

## 2018-09-27 ENCOUNTER — Encounter: Payer: Self-pay | Admitting: Neurology

## 2018-09-27 ENCOUNTER — Ambulatory Visit (INDEPENDENT_AMBULATORY_CARE_PROVIDER_SITE_OTHER): Payer: 59 | Admitting: Neurology

## 2018-09-27 VITALS — BP 142/76 | HR 72 | Ht 62.5 in | Wt 188.0 lb

## 2018-09-27 DIAGNOSIS — G43009 Migraine without aura, not intractable, without status migrainosus: Secondary | ICD-10-CM | POA: Diagnosis not present

## 2018-10-11 ENCOUNTER — Inpatient Hospital Stay (HOSPITAL_COMMUNITY): Payer: 59

## 2018-10-11 ENCOUNTER — Inpatient Hospital Stay (HOSPITAL_COMMUNITY)
Admission: AD | Admit: 2018-10-11 | Discharge: 2018-10-11 | Disposition: A | Discharge status: Home / Self Care | Payer: 59 | Source: Ambulatory Visit | Attending: Obstetrics and Gynecology | Admitting: Obstetrics and Gynecology

## 2018-10-11 ENCOUNTER — Encounter (HOSPITAL_COMMUNITY): Payer: Self-pay | Admitting: *Deleted

## 2018-10-11 DIAGNOSIS — Z886 Allergy status to analgesic agent status: Secondary | ICD-10-CM | POA: Insufficient documentation

## 2018-10-11 DIAGNOSIS — K219 Gastro-esophageal reflux disease without esophagitis: Secondary | ICD-10-CM | POA: Insufficient documentation

## 2018-10-11 DIAGNOSIS — Z79899 Other long term (current) drug therapy: Secondary | ICD-10-CM | POA: Insufficient documentation

## 2018-10-11 DIAGNOSIS — E7800 Pure hypercholesterolemia, unspecified: Secondary | ICD-10-CM | POA: Insufficient documentation

## 2018-10-11 DIAGNOSIS — M797 Fibromyalgia: Secondary | ICD-10-CM | POA: Insufficient documentation

## 2018-10-11 DIAGNOSIS — F4310 Post-traumatic stress disorder, unspecified: Secondary | ICD-10-CM | POA: Insufficient documentation

## 2018-10-11 DIAGNOSIS — Z7984 Long term (current) use of oral hypoglycemic drugs: Secondary | ICD-10-CM | POA: Diagnosis not present

## 2018-10-11 DIAGNOSIS — E119 Type 2 diabetes mellitus without complications: Secondary | ICD-10-CM | POA: Insufficient documentation

## 2018-10-11 DIAGNOSIS — N939 Abnormal uterine and vaginal bleeding, unspecified: Secondary | ICD-10-CM | POA: Diagnosis not present

## 2018-10-11 DIAGNOSIS — N83202 Unspecified ovarian cyst, left side: Secondary | ICD-10-CM | POA: Insufficient documentation

## 2018-10-11 DIAGNOSIS — Z85820 Personal history of malignant melanoma of skin: Secondary | ICD-10-CM | POA: Insufficient documentation

## 2018-10-11 DIAGNOSIS — Z881 Allergy status to other antibiotic agents status: Secondary | ICD-10-CM | POA: Insufficient documentation

## 2018-10-11 DIAGNOSIS — G4730 Sleep apnea, unspecified: Secondary | ICD-10-CM | POA: Insufficient documentation

## 2018-10-11 DIAGNOSIS — M545 Low back pain: Secondary | ICD-10-CM | POA: Diagnosis not present

## 2018-10-11 DIAGNOSIS — Z7982 Long term (current) use of aspirin: Secondary | ICD-10-CM | POA: Diagnosis not present

## 2018-10-11 DIAGNOSIS — R1032 Left lower quadrant pain: Secondary | ICD-10-CM | POA: Diagnosis not present

## 2018-10-11 DIAGNOSIS — Z87442 Personal history of urinary calculi: Secondary | ICD-10-CM | POA: Insufficient documentation

## 2018-10-11 DIAGNOSIS — I10 Essential (primary) hypertension: Secondary | ICD-10-CM | POA: Insufficient documentation

## 2018-10-11 DIAGNOSIS — F319 Bipolar disorder, unspecified: Secondary | ICD-10-CM | POA: Insufficient documentation

## 2018-10-11 DIAGNOSIS — Z3202 Encounter for pregnancy test, result negative: Secondary | ICD-10-CM | POA: Insufficient documentation

## 2018-10-11 DIAGNOSIS — Z888 Allergy status to other drugs, medicaments and biological substances status: Secondary | ICD-10-CM | POA: Insufficient documentation

## 2018-10-11 DIAGNOSIS — F17210 Nicotine dependence, cigarettes, uncomplicated: Secondary | ICD-10-CM | POA: Diagnosis not present

## 2018-10-11 HISTORY — DX: Pneumonia, unspecified organism: J18.9

## 2018-10-11 HISTORY — DX: Fibromyalgia: M79.7

## 2018-10-11 LAB — WET PREP, GENITAL
Clue Cells Wet Prep HPF POC: NONE SEEN
Sperm: NONE SEEN
Trich, Wet Prep: NONE SEEN
Yeast Wet Prep HPF POC: NONE SEEN

## 2018-10-11 LAB — URINALYSIS, ROUTINE W REFLEX MICROSCOPIC
Bacteria, UA: NONE SEEN
Bilirubin Urine: NEGATIVE
Glucose, UA: NEGATIVE mg/dL
Ketones, ur: NEGATIVE mg/dL
Leukocytes, UA: NEGATIVE
Nitrite: NEGATIVE
Protein, ur: NEGATIVE mg/dL
Specific Gravity, Urine: 1.019 (ref 1.005–1.030)
pH: 5 (ref 5.0–8.0)

## 2018-10-11 LAB — CBC
HCT: 46.5 % — ABNORMAL HIGH (ref 36.0–46.0)
Hemoglobin: 15.5 g/dL — ABNORMAL HIGH (ref 12.0–15.0)
MCH: 32.6 pg (ref 26.0–34.0)
MCHC: 33.3 g/dL (ref 30.0–36.0)
MCV: 97.9 fL (ref 80.0–100.0)
Platelets: 262 10*3/uL (ref 150–400)
RBC: 4.75 MIL/uL (ref 3.87–5.11)
RDW: 12.8 % (ref 11.5–15.5)
WBC: 13.1 10*3/uL — ABNORMAL HIGH (ref 4.0–10.5)
nRBC: 0 % (ref 0.0–0.2)

## 2018-10-11 LAB — POCT PREGNANCY, URINE: Preg Test, Ur: NEGATIVE

## 2018-10-11 LAB — HCG, QUANTITATIVE, PREGNANCY: hCG, Beta Chain, Quant, S: <1 m[IU]/mL (ref <5)

## 2018-10-11 NOTE — MAU Provider Note (Signed)
History     CSN: [id number]  Arrival date and time: 10/11/18 1728   First Provider Initiated Contact with Patient 10/11/18 1851      Chief Complaint  Patient presents with  . Possible Pregnancy  . Vaginal Bleeding  . Abdominal Pain   HPI Stacy Guzman 47 y.o.  Client of [facility].  Had an episode of one spontaneous clot- fist-sized- with no additional vaginal bleeding on Friday or Saturday.  Had clear fluid "sac" that came out after the clot.  This was similar to what she had with a previous miscarriage. The office instructed her to come here today for evaluation.  Two years ago had BTL and uterine ablation and usually does not have any bleeding.  Had fluttering in her abdomen last week and thought it might be a baby moving.  Since passing the clot, has had lower back pain and LLQ abdominal pain.  Was in physical therapy earlier this year for pelvic stability.  Has not taken any pain medication for her LLQ pain - has many allergies to medications.  OB History    Gravida  4   Para  3   Term  3   Preterm      AB  1   Living  3     SAB  1   TAB      Ectopic      Multiple      Live Births              Past Medical History:  Diagnosis Date  . Arthritis   . Bipolar 1 disorder ([place])   . Colon polyp   . Diabetes mellitus without complication ([place])   . Fibromyalgia   . GERD (gastroesophageal reflux disease)   . Hypercholesteremia   . Hypertension   . IBS (irritable bowel syndrome)   . Kidney stone   . Lung nodule   . Melanoma ([place])   . Migraine   . Pneumonia   . PTSD (post-traumatic stress disorder)   . Sleep apnea     Past Surgical History:  Procedure Laterality Date  . DILATION AND CURETTAGE OF UTERUS    . KNEE ARTHROSCOPY    . THROAT SURGERY    . TUBAL LIGATION  12/03/2015  . uterine ablation      Family History  Problem Relation Age of Onset  . Breast cancer Mother     Social History   Tobacco Use  . Smoking status: Current Some Day  Smoker    Packs/day: 2.00    Years: 36.00    Pack years: 72.00    Types: Cigarettes  . Smokeless tobacco: Never Used  Substance Use Topics  . Alcohol use: Yes  . Drug use: Yes    Types: Marijuana    Comment: pain relief    Allergies:  Allergies  Allergen Reactions  . Iohexol Anaphylaxis    Pt states she received dye at [facility] and developed airway/ tongue swelling   . Other Anaphylaxis    IV contrast  . Acetaminophen Other (See Comments)    headaches  . Celebrex [Celecoxib]     Mood changes  . Chantix [Varenicline] Other (See Comments)    Mood changes  . Crestor [Rosuvastatin Calcium]     Muscle pain/hives  . Ibuprofen     Migraine  . Keflex [Cephalexin]     Nausea/yeast infection  . Lamictal [Lamotrigine]     unknown  . Lyrica [Pregabalin]   .  Meloxicam     Stomach problems  . Metoclopramide Other (See Comments)    Mood changes  . Naproxen     Stomach pain  . Nsaids   . Prednisone Other (See Comments)    Causes sugar lvl to rise  . Risperidone And Related Swelling  . Voltaren [Diclofenac Sodium]     Medications Prior to Admission  Medication Sig Dispense Refill Last Dose  . aspirin EC 81 MG tablet Take 81 mg by mouth daily.   Not Taking  . atenolol (TENORMIN) 50 MG tablet Take 1 tablet (50 mg total) by mouth daily. (Patient not taking: Reported on 03/25/2018) 30 tablet 5 Not Taking  . b complex vitamins tablet Take 1 tablet by mouth daily.   Not Taking  . Cholecalciferol (VITAMIN D3) 5000 units TABS Take 5,000 Units by mouth daily. MWF    Not Taking  . CINNAMON PO Take 2 tablets by mouth every morning.   Not Taking  . dicyclomine (BENTYL) 20 MG tablet Take 20 mg by mouth 4 (four) times daily.   Not Taking  . diphenhydrAMINE (BENADRYL) 25 MG tablet Take 25 mg by mouth as needed for allergies.    Taking  . Fremanezumab-vfrm (AJOVY) 225 MG/1.5ML SOSY Inject 225 mg into the skin every 30 (thirty) days. (Patient not taking: Reported on  03/25/2018) 1 Syringe 11 Not Taking  . gabapentin (NEURONTIN) 600 MG tablet Take 3 capsules in AM and 2 capsules at bedtime (Patient not taking: Reported on 03/25/2018) 150 tablet 2 Not Taking  . Magnesium 500 MG TABS Take 500 mg by mouth daily.    Not Taking  . Melatonin 3 MG TABS Take 6 mg by mouth daily as needed (sleep).    Not Taking  . metFORMIN (GLUCOPHAGE) 1000 MG tablet Take 1,000 mg by mouth daily.    Not Taking  . metoprolol succinate (TOPROL-XL) 50 MG 24 hr tablet Take 50 mg by mouth daily.  1   . mometasone-formoterol (DULERA) 100-5 MCG/ACT AERO Inhale 2 puffs into the lungs as needed for wheezing or shortness of breath.   Not Taking  . nitrofurantoin, macrocrystal-monohydrate, (MACROBID) 100 MG capsule Take 1 capsule (100 mg total) by mouth 2 (two) times daily. (Patient not taking: Reported on 03/25/2018) 14 capsule 0 Not Taking  . Omega-3 Fatty Acids (FISH OIL) 1200 MG CAPS Take 1,200 mg by mouth daily.    Not Taking  . omeprazole (PRILOSEC) 40 MG capsule Take 40 mg by mouth daily.    Taking  . rizatriptan (MAXALT) 10 MG tablet Take 1 tablet earliest onset of migraine.  May repeat once in 2 hours if needed.  Do not exceed 2 tablets in 24 hours (Patient not taking: Reported on 03/25/2018) 10 tablet 2 Not Taking  . Turmeric 500 MG TABS Take 500 mg by mouth 2 (two) times daily.    Not Taking  . valACYclovir (VALTREX) 1000 MG tablet Take 1 tablet (1,000 mg total) by mouth 3 (three) times daily. (Patient not taking: Reported on 03/25/2018) 30 tablet 9 Not Taking    Review of Systems  Constitutional: Negative for fever.  Gastrointestinal: Positive for abdominal pain. Negative for nausea and vomiting.  Genitourinary: Negative for dysuria, vaginal bleeding and vaginal discharge.   Physical Exam   Blood pressure (!) 154/71, pulse 76, temperature 98.4 F (36.9 C), temperature source Oral, resp. rate 16, height 5\' 3"  (1.6 m), SpO2 100 %.  Physical Exam  Nursing note and vitals  reviewed. Constitutional: She is  oriented to person, place, and time. She appears well-developed and well-nourished.  HENT:  Head: Normocephalic.  Eyes: EOM are normal.  Neck: Neck supple.  Cardiovascular: Normal rate.  Respiratory: Effort normal.  GI: Soft. Bowel sounds are normal. There is no abdominal tenderness. There is no rebound and no guarding.  Genitourinary:    Genitourinary Comments: Speculum exam: Vagina - Small amount of blood seen, no odor Cervix - Difficult to fully visualize due to vaginal walls folding across the cervix Bimanual exam: Cervix closed Uterus non tender, normal size Adnexa relatively tender, no masses bilaterally GC/Chlam, wet prep done Chaperone present for exam.   Musculoskeletal: Normal range of motion.  Neurological: She is alert and oriented to person, place, and time.  Skin: Skin is warm and dry.  Psychiatric: She has a normal mood and affect.    MAU Course  Procedures Results for orders placed or performed during the hospital encounter of 10/11/18 (from the past 24 hour(s))  Urinalysis, Routine w reflex microscopic     Status: Abnormal   Collection Time: 10/11/18  6:12 PM  Result Value Ref Range   Color, Urine YELLOW YELLOW   APPearance CLEAR CLEAR   Specific Gravity, Urine 1.019 1.005 - 1.030   pH 5.0 5.0 - 8.0   Glucose, UA NEGATIVE NEGATIVE mg/dL   Hgb urine dipstick LARGE (A) NEGATIVE   Bilirubin Urine NEGATIVE NEGATIVE   Ketones, ur NEGATIVE NEGATIVE mg/dL   Protein, ur NEGATIVE NEGATIVE mg/dL   Nitrite NEGATIVE NEGATIVE   Leukocytes, UA NEGATIVE NEGATIVE   RBC / HPF 0-5 0 - 5 RBC/hpf   WBC, UA 0-5 0 - 5 WBC/hpf   Bacteria, UA NONE SEEN NONE SEEN   Squamous Epithelial / LPF 0-5 0 - 5  Pregnancy, urine POC     Status: None   Collection Time: 10/11/18  6:17 PM  Result Value Ref Range   Preg Test, Ur NEGATIVE NEGATIVE  Wet prep, genital     Status: Abnormal   Collection Time: 10/11/18  7:08 PM  Result Value Ref Range   Yeast  Wet Prep HPF POC NONE SEEN NONE SEEN   Trich, Wet Prep NONE SEEN NONE SEEN   Clue Cells Wet Prep HPF POC NONE SEEN NONE SEEN   WBC, Wet Prep HPF POC MANY (A) NONE SEEN   Sperm NONE SEEN   hCG, quantitative, pregnancy     Status: None   Collection Time: 10/11/18  7:26 PM  Result Value Ref Range   hCG, Beta Chain, Quant, S <1 <5 mIU/mL  CBC     Status: Abnormal   Collection Time: 10/11/18  7:26 PM  Result Value Ref Range   WBC 13.1 (H) 4.0 - 10.5 K/uL   RBC 4.75 3.87 - 5.11 MIL/uL   Hemoglobin 15.5 (H) 12.0 - 15.0 g/dL   HCT 46.5 (H) 36.0 - 46.0 %   MCV 97.9 80.0 - 100.0 fL   MCH 32.6 26.0 - 34.0 pg   MCHC 33.3 30.0 - 36.0 g/dL   RDW 12.8 11.5 - 15.5 %   Platelets 262 150 - 400 K/uL   nRBC 0.0 0.0 - 0.2 %    CLINICAL DATA:  Abnormal uterine bleeding. Left lower quadrant pain.  EXAM: TRANSABDOMINAL AND TRANSVAGINAL ULTRASOUND OF PELVIS  TECHNIQUE: Both transabdominal and transvaginal ultrasound examinations of the pelvis were performed. Transabdominal technique was performed for global imaging of the pelvis including uterus, ovaries, adnexal regions, and pelvic cul-de-sac. It was necessary to proceed with endovaginal  exam following the transabdominal exam to visualize the uterus, endometrium, ovaries and adnexa.  COMPARISON:  CT 12/28/2016  FINDINGS: Uterus  Measurements: 7.6 x 3.7 x 5.0 cm = volume: 73 mL. No fibroids or other mass visualized.  Endometrium  Thickness: 5 mm in thickness. Small subendocardial cysts within the uterus.  Right ovary  Measurements: Not visualized.  No adnexal mass seen.  Left ovary  Measurements: 4.2 x 2.7 x 2.7 cm = volume: 15 mL. 2.7 cm simple appearing cyst.  Other findings  No abnormal free fluid.  IMPRESSION: Small subendometrial cysts within the uterus. Endometrium normal thickness.  2.7 cm simple appearing left ovarian cyst or dominant follicle.   MDM No pregnancy identified.  No infectous process  found today.  Pain has been manageable for her.  Will have her follow up with the office as she is now hiving mild vaginal bleeding after ablation.  Assessment and Plan  Not pregnant Abnormal vaginal bleeding after ablation 2 years ago Small left ovarian cyst or could be past of ovulatory process. Subendomtrial cysts  Plan Follow up in the office. Take pain medication that you can handle if needed for pain.  Aeisha Minarik L Soundra Lampley 10/11/2018, 6:52 PM

## 2018-10-11 NOTE — MAU Note (Signed)
Pt reports states her doctor's office thinks she may have a ectopic pregnancy. States she had an ablation and a tubal ligation 2 years ago. Started bleeding on Saturday and passed a large clot.

## 2018-10-11 NOTE — Discharge Instructions (Signed)
See your doctor for follow up. No pregnancy is found today. Take pain medication that you have if you need it.

## 2018-10-12 LAB — RPR: RPR Ser Ql: NONREACTIVE

## 2018-10-12 LAB — HIV ANTIBODY (ROUTINE TESTING W REFLEX): HIV Screen 4th Generation wRfx: NONREACTIVE

## 2018-10-15 LAB — GC/CHLAMYDIA PROBE AMP (~~LOC~~) NOT AT ARMC
Chlamydia: NEGATIVE
Neisseria Gonorrhea: NEGATIVE

## 2019-03-25 NOTE — Progress Notes (Signed)
Virtual Visit via Video Note The purpose of this virtual visit is to provide medical care while limiting exposure to the novel coronavirus.    Consent was obtained for video visit:  Yes.   Answered questions that patient had about telehealth interaction:  Yes.   I discussed the limitations, risks, security and privacy concerns of performing an evaluation and management service by telemedicine. I also discussed with the patient that there may be a patient responsible charge related to this service. The patient expressed understanding and agreed to proceed.  Pt location: Home Physician Location: Home Name of referring provider:  Kelton Pillar, MD I connected with Azucena Cecil at patients initiation/request on 03/28/2019 at  8:50 AM EDT by video enabled telemedicine application and verified that I am speaking with the correct person using two identifiers. Pt MRN:  [id number] Pt DOB:  03/18/1971 Video Participants:  Azucena Cecil   History of Present Illness:  Stacy Guzman is a 48 year old woman with type 2 diabetes mellitus, hypertension, hyperlipidemia, IBS, fibromyalgia, depression, anxiety and Bipolar disorder who follows up for migraines.  UPDATE: Intensity:  Moderate to severe Duration:  Up to 5 hours Frequency:  3 over last 6 months Current NSAIDS:  None Current analgesics: None Current triptans: None Current ergotamine: None Current anti-emetic: None Current muscle relaxants:  Flexeril (not daily because makes her drowsy) Current anti-anxiolytic: None Current sleep aide: melatonin 3mg  Current Antihypertensive medications: Toprol-XL 50mg  Current Antidepressant medications: None Current Anticonvulsant medications: None Current anti-CGRP: None Current Vitamins/Herbal/Supplements: melatonin Current Antihistamines/Decongestants:  Benadryl as needed Other therapy:  Daith piercing  Neck pain is an issue, radiating into the left arm.    Caffeine:  1 pot of coffee daily  Diet:  hydrates Exercise:  walks Depression:  yes; Anxiety:  yes. Lesion on scalp was not melanoma but a rare type of infection. Other pain:  fibromyalgia Sleep hygiene:  okay  HISTORY: Onset:  Since teenager Location: Bi-occipital radiating to bifrontal region Quality: Vice-like Initial Intensity: "12"/10 Aura: no Prodrome: no Postdrome: no Associated symptoms:  Photophobia, phonophobia. No nausea or visual disturbance. She has not had any new worse headache of her life, waking up from sleep Initial Duration: 5 hours (sometimes days) Initial Frequency: 10 days per month, worse during her cycle Triggers:  Hormonal, chocolate, caffeine, stress, Zyrtec Relieving factors:  Rest Activity: aggravates  Past NSAIDS: All NSAIDS (ibuprofen, naproxen) make headache worse Past analgesics: Tylenol and Excedrin make headache worse), tramadol Past abortive triptans: Sumatriptan ([place]or/tab), Relpax, Maxalt (ineffective)  Past muscle relaxants: cyclobenzaprine Past anti-emetic: no Past antihypertensive medications:Atenolol 50mg  Past antidepressant medications: Cymbalta, amitriptyline, Effexor (side effects) Past anticonvulsant medications: topiramate (side effects), Depakote (maybe many years ago), gabapentin (abdominal pain) Past anti-CGRP: Ajovy (effective but prefers no injections) Past vitamins/Herbal/Supplements: no Past antihistamines/decongestants: no Other past therapies: Cervical or occipital ablations, Botox (worked but could not afford and does not like needles)  Family history of headache: yes  Due to vertigo and worsening headache, MRI of brain without contrast was performed on 08/27/17, which was personally reviewed and was unremarkable. She was sent for vestibular rehabilitation. Assessment was negative for peripheral etiology. She was also seen in PT for balance problems following knee surgery. She endorsed sensation of something crawling in her ear.  She was evaluated by ENT. Audiometric testing showed very mild symmetric prebycusis pattern, age related and not significant. Tympanograms were normal.  Past Medical History: Past Medical History:  Diagnosis Date  . Arthritis   . Bipolar  1 disorder ([place])   . Colon polyp   . Diabetes mellitus without complication ([place])   . Fibromyalgia   . GERD (gastroesophageal reflux disease)   . Hypercholesteremia   . Hypertension   . IBS (irritable bowel syndrome)   . Kidney stone   . Lung nodule   . Melanoma ([place])   . Migraine   . Pneumonia   . PTSD (post-traumatic stress disorder)   . Sleep apnea     Medications: Outpatient Encounter Medications as of 03/28/2019  Medication Sig  . diphenhydrAMINE (BENADRYL) 25 MG tablet Take 25 mg by mouth as needed for allergies.   . Melatonin 3 MG TABS Take 6 mg by mouth daily as needed (sleep).   . metoprolol succinate (TOPROL-XL) 50 MG 24 hr tablet Take 50 mg by mouth daily.  Marland Kitchen omeprazole (PRILOSEC) 40 MG capsule Take 40 mg by mouth daily.    No facility-administered encounter medications on file as of 03/28/2019.     Allergies: Allergies  Allergen Reactions  . Iohexol Anaphylaxis    Pt states she received dye at [facility] and developed airway/ tongue swelling   . Other Anaphylaxis    IV contrast  . Acetaminophen Other (See Comments)    headaches  . Celebrex [Celecoxib]     Mood changes  . Chantix [Varenicline] Other (See Comments)    Mood changes  . Crestor [Rosuvastatin Calcium]     Muscle pain/hives  . Ibuprofen     Migraine  . Keflex [Cephalexin]     Nausea/yeast infection  . Lamictal [Lamotrigine]     unknown  . Lyrica [Pregabalin]   . Meloxicam     Stomach problems  . Metoclopramide Other (See Comments)    Mood changes  . Naproxen     Stomach pain  . Nsaids   . Prednisone Other (See Comments)    Causes sugar lvl to rise  . Risperidone And Related Swelling  . Voltaren [Diclofenac Sodium]     Family  History: Family History  Problem Relation Age of Onset  . Breast cancer Mother     Social History: Social History   Socioeconomic History  . Marital status: Married    Spouse name: Not on file  . Number of children: Not on file  . Years of education: Not on file  . Highest education level: Not on file  Occupational History  . Not on file  Social Needs  . Financial resource strain: Not on file  . Food insecurity    Worry: Not on file    Inability: Not on file  . Transportation needs    Medical: Not on file    Non-medical: Not on file  Tobacco Use  . Smoking status: Current Some Day Smoker    Packs/day: 2.00    Years: 36.00    Pack years: 72.00    Types: Cigarettes  . Smokeless tobacco: Never Used  Substance and Sexual Activity  . Alcohol use: Yes  . Drug use: Yes    Types: Marijuana    Comment: pain relief  . Sexual activity: Yes    Partners: Male  Lifestyle  . Physical activity    Days per week: Not on file    Minutes per session: Not on file  . Stress: Not on file  Relationships  . Social Herbalist on phone: Not on file    Gets together: Not on file    Attends religious service: Not on file  Active member of club or organization: Not on file    Attends meetings of clubs or organizations: Not on file    Relationship status: Not on file  . Intimate partner violence    Fear of current or ex partner: Not on file    Emotionally abused: Not on file    Physically abused: Not on file    Forced sexual activity: Not on file  Other Topics Concern  . Not on file  Social History Narrative  . Not on file   Observations/Objective:       Assessment and Plan:   Migraine without aura, without status migrainosus, not intractable  Migraines are well-controlled.  Not on preventative medication.  She will follow up as needed.  Follow Up Instructions:    -I discussed the assessment and treatment plan with the patient. The patient was provided an  opportunity to ask questions and all were answered. The patient agreed with the plan and demonstrated an understanding of the instructions.   The patient was advised to call back or seek an in-person evaluation if the symptoms worsen or if the condition fails to improve as anticipated.    Total Time spent in visit with the patient was:  32 minutes.   Dudley Major, DO

## 2019-03-28 ENCOUNTER — Telehealth (INDEPENDENT_AMBULATORY_CARE_PROVIDER_SITE_OTHER): Payer: 59 | Admitting: Neurology

## 2019-03-28 ENCOUNTER — Encounter: Payer: Self-pay | Admitting: Neurology

## 2019-03-28 ENCOUNTER — Other Ambulatory Visit: Payer: Self-pay

## 2019-03-28 VITALS — Ht 62.5 in | Wt 185.0 lb

## 2019-03-28 DIAGNOSIS — M797 Fibromyalgia: Secondary | ICD-10-CM

## 2019-03-28 DIAGNOSIS — G43009 Migraine without aura, not intractable, without status migrainosus: Secondary | ICD-10-CM | POA: Diagnosis not present

## 2019-05-27 ENCOUNTER — Other Ambulatory Visit: Payer: Self-pay | Admitting: Family Medicine

## 2019-05-27 DIAGNOSIS — R109 Unspecified abdominal pain: Secondary | ICD-10-CM

## 2019-05-30 ENCOUNTER — Ambulatory Visit
Admission: RE | Admit: 2019-05-30 | Discharge: 2019-05-30 | Disposition: A | Discharge status: Home / Self Care | Payer: 59 | Source: Ambulatory Visit | Attending: Family Medicine | Admitting: Family Medicine

## 2019-05-30 ENCOUNTER — Other Ambulatory Visit: Payer: Self-pay

## 2019-05-30 DIAGNOSIS — R109 Unspecified abdominal pain: Secondary | ICD-10-CM

## 2019-06-06 ENCOUNTER — Other Ambulatory Visit: Payer: 59

## 2019-06-10 ENCOUNTER — Ambulatory Visit (INDEPENDENT_AMBULATORY_CARE_PROVIDER_SITE_OTHER): Payer: 59 | Admitting: Podiatry

## 2019-06-10 ENCOUNTER — Other Ambulatory Visit: Payer: 59 | Admitting: Orthotics

## 2019-06-10 ENCOUNTER — Other Ambulatory Visit: Payer: Self-pay

## 2019-06-10 ENCOUNTER — Encounter: Payer: Self-pay | Admitting: Podiatry

## 2019-06-10 DIAGNOSIS — M205X1 Other deformities of toe(s) (acquired), right foot: Secondary | ICD-10-CM

## 2019-06-10 DIAGNOSIS — M2021 Hallux rigidus, right foot: Secondary | ICD-10-CM

## 2019-06-10 DIAGNOSIS — M2022 Hallux rigidus, left foot: Secondary | ICD-10-CM

## 2019-06-10 DIAGNOSIS — M205X2 Other deformities of toe(s) (acquired), left foot: Secondary | ICD-10-CM

## 2019-06-10 DIAGNOSIS — M25579 Pain in unspecified ankle and joints of unspecified foot: Secondary | ICD-10-CM

## 2019-06-10 DIAGNOSIS — E1142 Type 2 diabetes mellitus with diabetic polyneuropathy: Secondary | ICD-10-CM | POA: Diagnosis not present
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
## 2019-07-11 ENCOUNTER — Other Ambulatory Visit: Payer: Self-pay

## 2019-07-11 ENCOUNTER — Ambulatory Visit (INDEPENDENT_AMBULATORY_CARE_PROVIDER_SITE_OTHER): Payer: 59 | Admitting: Orthotics

## 2019-07-11 DIAGNOSIS — M25579 Pain in unspecified ankle and joints of unspecified foot: Secondary | ICD-10-CM

## 2019-07-11 DIAGNOSIS — E1142 Type 2 diabetes mellitus with diabetic polyneuropathy: Secondary | ICD-10-CM

## 2019-07-11 DIAGNOSIS — M205X1 Other deformities of toe(s) (acquired), right foot: Secondary | ICD-10-CM

## 2019-07-11 DIAGNOSIS — M205X2 Other deformities of toe(s) (acquired), left foot: Secondary | ICD-10-CM

## 2019-07-11 DIAGNOSIS — M2022 Hallux rigidus, left foot: Secondary | ICD-10-CM

## 2019-07-11 DIAGNOSIS — M2021 Hallux rigidus, right foot: Secondary | ICD-10-CM

## 2019-07-11 NOTE — Progress Notes (Signed)

## 2019-07-14 HISTORY — PX: TOTAL ABDOMINAL HYSTERECTOMY: SHX209

## 2019-08-10 ENCOUNTER — Other Ambulatory Visit: Payer: Self-pay | Admitting: Family Medicine

## 2019-08-10 DIAGNOSIS — Z1231 Encounter for screening mammogram for malignant neoplasm of breast: Secondary | ICD-10-CM

## 2019-08-11 DIAGNOSIS — Z90710 Acquired absence of both cervix and uterus: Secondary | ICD-10-CM | POA: Insufficient documentation

## 2019-08-21 DIAGNOSIS — R05 Cough: Secondary | ICD-10-CM | POA: Insufficient documentation

## 2019-08-21 DIAGNOSIS — Z5321 Procedure and treatment not carried out due to patient leaving prior to being seen by health care provider: Secondary | ICD-10-CM | POA: Diagnosis not present

## 2019-08-22 ENCOUNTER — Other Ambulatory Visit: Payer: Self-pay

## 2019-08-22 ENCOUNTER — Emergency Department (HOSPITAL_COMMUNITY)
Admission: EM | Admit: 2019-08-22 | Discharge: 2019-08-22 | Disposition: A | Discharge status: Home / Self Care | Payer: 59 | Attending: Emergency Medicine | Admitting: Emergency Medicine

## 2019-08-22 ENCOUNTER — Emergency Department (HOSPITAL_COMMUNITY): Payer: 59

## 2019-08-22 DIAGNOSIS — R05 Cough: Secondary | ICD-10-CM | POA: Diagnosis not present

## 2019-08-22 LAB — CBC WITH DIFFERENTIAL/PLATELET
Abs Immature Granulocytes: 0.04 10*3/uL (ref 0.00–0.07)
Basophils Absolute: 0.1 10*3/uL (ref 0.0–0.1)
Basophils Relative: 1 %
Eosinophils Absolute: 0.2 10*3/uL (ref 0.0–0.5)
Eosinophils Relative: 2 %
HCT: 38.5 % (ref 36.0–46.0)
Hemoglobin: 12.5 g/dL (ref 12.0–15.0)
Immature Granulocytes: 0 %
Lymphocytes Relative: 32 %
Lymphs Abs: 3.5 10*3/uL (ref 0.7–4.0)
MCH: 31.4 pg (ref 26.0–34.0)
MCHC: 32.5 g/dL (ref 30.0–36.0)
MCV: 96.7 fL (ref 80.0–100.0)
Monocytes Absolute: 0.7 10*3/uL (ref 0.1–1.0)
Monocytes Relative: 7 %
Neutro Abs: 6.3 10*3/uL (ref 1.7–7.7)
Neutrophils Relative %: 58 %
Platelets: 304 10*3/uL (ref 150–400)
RBC: 3.98 MIL/uL (ref 3.87–5.11)
RDW: 12.9 % (ref 11.5–15.5)
WBC: 10.7 10*3/uL — ABNORMAL HIGH (ref 4.0–10.5)
nRBC: 0 % (ref 0.0–0.2)

## 2019-08-22 LAB — URINALYSIS, ROUTINE W REFLEX MICROSCOPIC
Bacteria, UA: NONE SEEN
Bilirubin Urine: NEGATIVE
Glucose, UA: NEGATIVE mg/dL
Ketones, ur: NEGATIVE mg/dL
Nitrite: POSITIVE — AB
Protein, ur: NEGATIVE mg/dL
Specific Gravity, Urine: 1.026 (ref 1.005–1.030)
pH: 5 (ref 5.0–8.0)

## 2019-08-22 LAB — BASIC METABOLIC PANEL
Anion gap: 10 (ref 5–15)
BUN: 15 mg/dL (ref 6–20)
CO2: 24 mmol/L (ref 22–32)
Calcium: 9.1 mg/dL (ref 8.9–10.3)
Chloride: 103 mmol/L (ref 98–111)
Creatinine, Ser: 0.48 mg/dL (ref 0.44–1.00)
GFR calc Af Amer: >60 mL/min (ref >60)
GFR calc non Af Amer: >60 mL/min (ref >60)
Glucose, Bld: 125 mg/dL — ABNORMAL HIGH (ref 70–99)
Potassium: 4 mmol/L (ref 3.5–5.1)
Sodium: 137 mmol/L (ref 135–145)

## 2019-08-22 NOTE — ED Notes (Signed)
Registration stated pt left due to long wait time

## 2019-08-22 NOTE — ED Triage Notes (Addendum)
Per pt she has been coughing and coughing up blood clots today. Pt said she had hysterectomy 1 week ago.Having right flank pain but that has been going on for a while.  Pt said she is a smoker. Pt said hx of copd and pneumonia also. Pt said some SOB. No fevers, no chills,

## 2019-09-30 ENCOUNTER — Ambulatory Visit
Admission: RE | Admit: 2019-09-30 | Discharge: 2019-09-30 | Disposition: A | Discharge status: Home / Self Care | Payer: 59 | Source: Ambulatory Visit | Attending: Family Medicine | Admitting: Family Medicine

## 2019-09-30 ENCOUNTER — Other Ambulatory Visit: Payer: Self-pay

## 2019-09-30 DIAGNOSIS — Z1231 Encounter for screening mammogram for malignant neoplasm of breast: Secondary | ICD-10-CM

## 2020-07-10 ENCOUNTER — Other Ambulatory Visit: Payer: Self-pay | Admitting: Family Medicine

## 2020-07-10 DIAGNOSIS — Z0000 Encounter for general adult medical examination without abnormal findings: Secondary | ICD-10-CM

## 2020-08-21 ENCOUNTER — Other Ambulatory Visit: Payer: Self-pay

## 2020-08-21 ENCOUNTER — Ambulatory Visit: Payer: Medicare Other | Admitting: Orthotics

## 2020-08-21 ENCOUNTER — Ambulatory Visit (INDEPENDENT_AMBULATORY_CARE_PROVIDER_SITE_OTHER): Payer: Medicare Other | Admitting: Podiatry

## 2020-08-21 ENCOUNTER — Encounter: Payer: Self-pay | Admitting: Podiatry

## 2020-08-21 DIAGNOSIS — M205X2 Other deformities of toe(s) (acquired), left foot: Secondary | ICD-10-CM

## 2020-08-21 DIAGNOSIS — M205X1 Other deformities of toe(s) (acquired), right foot: Secondary | ICD-10-CM

## 2020-08-21 DIAGNOSIS — M722 Plantar fascial fibromatosis: Secondary | ICD-10-CM | POA: Diagnosis not present

## 2020-08-21 DIAGNOSIS — E1142 Type 2 diabetes mellitus with diabetic polyneuropathy: Secondary | ICD-10-CM | POA: Diagnosis not present
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
## 2020-08-21 NOTE — Progress Notes (Signed)

## 2020-10-01 ENCOUNTER — Ambulatory Visit
Admission: RE | Admit: 2020-10-01 | Discharge: 2020-10-01 | Disposition: A | Discharge status: Home / Self Care | Payer: 59 | Source: Ambulatory Visit | Attending: Family Medicine | Admitting: Family Medicine

## 2020-10-01 ENCOUNTER — Other Ambulatory Visit: Payer: Self-pay

## 2020-10-01 DIAGNOSIS — Z0000 Encounter for general adult medical examination without abnormal findings: Secondary | ICD-10-CM

## 2020-10-08 ENCOUNTER — Telehealth: Payer: Self-pay | Admitting: Podiatry

## 2020-10-08 NOTE — Telephone Encounter (Signed)
Pt left message asking if her diabetic shoes have came in yet.  Returned call and left message that they are in and I tried calling on 12.16 but vm was full and she does not have an active mychart account to send a message. I asked pt to call back and get scheduled to see [facility] tomorrow as that is the last day he will be in the office for this year and if we do not the shoes will be considered 2022 shoes.

## 2020-10-09 ENCOUNTER — Ambulatory Visit (INDEPENDENT_AMBULATORY_CARE_PROVIDER_SITE_OTHER): Payer: Medicare Other | Admitting: Orthotics

## 2020-10-09 ENCOUNTER — Other Ambulatory Visit: Payer: Self-pay

## 2020-10-09 DIAGNOSIS — E1165 Type 2 diabetes mellitus with hyperglycemia: Secondary | ICD-10-CM

## 2020-10-09 DIAGNOSIS — E1142 Type 2 diabetes mellitus with diabetic polyneuropathy: Secondary | ICD-10-CM | POA: Diagnosis not present

## 2020-10-09 DIAGNOSIS — M205X2 Other deformities of toe(s) (acquired), left foot: Secondary | ICD-10-CM | POA: Diagnosis not present

## 2020-10-09 DIAGNOSIS — M205X1 Other deformities of toe(s) (acquired), right foot: Secondary | ICD-10-CM | POA: Diagnosis not present

## 2020-10-10 ENCOUNTER — Telehealth: Payer: Self-pay | Admitting: Podiatry

## 2020-10-10 NOTE — Telephone Encounter (Signed)
Pt left message stating when she got home the shoes she picked up yesterday had a seam that busted. She is not able to bring it back this morning due to car trouble.  I called and left message for pt to call me back and she did. She is wanting the same shoe and I told pt I would order it and when it comes in I will call to schedule an appt to pick it up and she can bring the defective pair back at that time. She was concerned about it being next yrs shoes and I explained that it would not be.  Shoes have been ordered.

## 2020-10-19 ENCOUNTER — Telehealth: Payer: Self-pay | Admitting: Podiatry

## 2020-10-19 NOTE — Telephone Encounter (Signed)
Called pt because the replacement boot we ordered for her is on back order until august. She said it was ok to order the black mens work boot with laces. I have ordered it and will call when it come in.

## 2020-10-26 ENCOUNTER — Other Ambulatory Visit: Payer: Self-pay

## 2020-10-26 ENCOUNTER — Ambulatory Visit: Payer: Medicare Other | Admitting: Orthotics

## 2020-10-26 DIAGNOSIS — M205X1 Other deformities of toe(s) (acquired), right foot: Secondary | ICD-10-CM

## 2020-10-26 DIAGNOSIS — E1142 Type 2 diabetes mellitus with diabetic polyneuropathy: Secondary | ICD-10-CM

## 2020-10-26 DIAGNOSIS — M205X2 Other deformities of toe(s) (acquired), left foot: Secondary | ICD-10-CM

## 2020-10-26 DIAGNOSIS — M25579 Pain in unspecified ankle and joints of unspecified foot: Secondary | ICD-10-CM

## 2020-10-26 NOTE — Progress Notes (Signed)
Picked up replaced shoes from defec.

## 2020-11-12 ENCOUNTER — Telehealth: Payer: Self-pay | Admitting: Podiatry
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
## 2020-11-20 ENCOUNTER — Other Ambulatory Visit: Payer: Self-pay

## 2020-11-20 ENCOUNTER — Ambulatory Visit: Payer: Medicare Other | Admitting: Orthotics

## 2020-11-20 DIAGNOSIS — M205X1 Other deformities of toe(s) (acquired), right foot: Secondary | ICD-10-CM

## 2020-11-20 DIAGNOSIS — M205X2 Other deformities of toe(s) (acquired), left foot: Secondary | ICD-10-CM

## 2020-11-20 DIAGNOSIS — E1142 Type 2 diabetes mellitus with diabetic polyneuropathy: Secondary | ICD-10-CM

## 2020-11-20 NOTE — Progress Notes (Signed)
Add poron padding to upper part of boot to relieve pressure from a bony prominence.

## 2020-11-28 DIAGNOSIS — K6389 Other specified diseases of intestine: Secondary | ICD-10-CM | POA: Diagnosis not present

## 2020-11-28 DIAGNOSIS — R198 Other specified symptoms and signs involving the digestive system and abdomen: Secondary | ICD-10-CM | POA: Diagnosis not present

## 2020-12-05 ENCOUNTER — Other Ambulatory Visit: Payer: Self-pay | Admitting: Gastroenterology

## 2020-12-05 DIAGNOSIS — E1169 Type 2 diabetes mellitus with other specified complication: Secondary | ICD-10-CM | POA: Diagnosis not present

## 2020-12-05 DIAGNOSIS — K6389 Other specified diseases of intestine: Secondary | ICD-10-CM

## 2020-12-20 ENCOUNTER — Other Ambulatory Visit: Payer: Self-pay

## 2020-12-20 ENCOUNTER — Ambulatory Visit (INDEPENDENT_AMBULATORY_CARE_PROVIDER_SITE_OTHER): Payer: Medicare Other

## 2020-12-20 ENCOUNTER — Ambulatory Visit (INDEPENDENT_AMBULATORY_CARE_PROVIDER_SITE_OTHER): Payer: Medicare Other | Admitting: Podiatry

## 2020-12-20 DIAGNOSIS — M797 Fibromyalgia: Secondary | ICD-10-CM | POA: Diagnosis not present

## 2020-12-20 DIAGNOSIS — S99911S Unspecified injury of right ankle, sequela: Secondary | ICD-10-CM

## 2020-12-20 DIAGNOSIS — E1142 Type 2 diabetes mellitus with diabetic polyneuropathy: Secondary | ICD-10-CM

## 2020-12-20 DIAGNOSIS — M25571 Pain in right ankle and joints of right foot: Secondary | ICD-10-CM

## 2020-12-20 NOTE — Patient Instructions (Signed)
Stretching and range-of-motion exercises These exercises warm up your muscles and joints and improve the movement and flexibility in your ankle and foot. These exercises may also help to relieve pain. Standing wall calf stretch, knee straight   1. Stand with your hands against a wall. 2. Extend your left / right leg behind you, and bend your front knee slightly. If directed, place a folded washcloth under the arch of your foot for support. 3. Point the toes of your back foot slightly inward. 4. Keeping your heels on the floor and your back knee straight, shift your weight toward the wall. Do not allow your back to arch. You should feel a gentle stretch in your upper left / right calf. 5. Hold this position for 10 seconds. Repeat 10 times. Complete this exercise 2 times a day. Standing wall calf stretch, knee bent 1. Stand with your hands against a wall. 2. Extend your left / right leg behind you, and bend your front knee slightly. If directed, place a folded washcloth under the arch of your foot for support. 3. Point the toes of your back foot slightly inward. 4. Unlock your back knee so it is bent. Keep your heels on the floor. You should feel a gentle stretch deep in your lower left / right calf. 5. Hold this position for 10 seconds. Repeat 10 times. Complete this exercise 2 times a day. Strengthening exercises These exercises build strength and endurance in your ankle and foot. Endurance is the ability to use your muscles for a long time, even after they get tired. Ankle inversion with band 1. Secure one end of a rubber exercise band or tubing to a fixed object, such as a table leg or a pole, that will stay still when the band is pulled. 2. Loop the other end of the band around the middle of your left / right foot. 3. Sit on the floor facing the object with your left / right leg extended. The band or tube should be slightly tense when your foot is relaxed. 4. Leading with your big toe,  slowly bring your left / right foot and ankle inward, toward your other foot (inversion). 5. Hold this position for 10 seconds. 6. Slowly return your foot to the starting position. Repeat 10 times. Complete this exercise 2 times a day. Towel curls   1. Sit in a chair on a non-carpeted surface, and put your feet on the floor. 2. Place a towel in front of your feet. 3. Keeping your heel on the floor, put your left / right foot on the towel. 4. Pull the towel toward you by grabbing the towel with your toes and curling them under. Keep your heel on the floor while you do this. 5. Let your toes relax. 6. Grab the towel with your toes again. Keep going until the towel is completely underneath your foot. Repeat 10 times. Complete this exercise 2 times a day. Balance exercise This exercise improves or maintains your balance. Balance is important in preventing falls. Single leg stand 1. Without wearing shoes, stand near a railing or in a doorway. You may hold on to the railing or door frame as needed for balance. 2. Stand on your left / right foot. Keep your big toe down on the floor and try to keep your arch lifted. ? If balancing in this position is too easy, try the exercise with your eyes closed or while standing on a pillow. 3. Hold this position for 10 seconds.  Repeat 10 times. Complete this exercise 2 times a day. This information is not intended to replace advice given to you by your health care provider. Make sure you discuss any questions you have with your health care provider.

## 2020-12-21 ENCOUNTER — Ambulatory Visit
Admission: RE | Admit: 2020-12-21 | Discharge: 2020-12-21 | Disposition: A | Discharge status: Home / Self Care | Payer: Medicare Other | Source: Ambulatory Visit | Attending: Gastroenterology | Admitting: Gastroenterology

## 2020-12-21 ENCOUNTER — Encounter: Payer: Self-pay | Admitting: Podiatry

## 2020-12-21 DIAGNOSIS — Z4689 Encounter for fitting and adjustment of other specified devices: Secondary | ICD-10-CM | POA: Diagnosis not present

## 2020-12-21 DIAGNOSIS — N200 Calculus of kidney: Secondary | ICD-10-CM | POA: Diagnosis not present

## 2020-12-21 DIAGNOSIS — E7800 Pure hypercholesterolemia, unspecified: Secondary | ICD-10-CM | POA: Insufficient documentation

## 2020-12-21 DIAGNOSIS — M5136 Other intervertebral disc degeneration, lumbar region: Secondary | ICD-10-CM | POA: Insufficient documentation

## 2020-12-21 DIAGNOSIS — K580 Irritable bowel syndrome with diarrhea: Secondary | ICD-10-CM | POA: Insufficient documentation

## 2020-12-21 DIAGNOSIS — N281 Cyst of kidney, acquired: Secondary | ICD-10-CM | POA: Diagnosis not present

## 2020-12-21 DIAGNOSIS — F319 Bipolar disorder, unspecified: Secondary | ICD-10-CM | POA: Insufficient documentation

## 2020-12-21 DIAGNOSIS — C439 Malignant melanoma of skin, unspecified: Secondary | ICD-10-CM | POA: Insufficient documentation

## 2020-12-21 DIAGNOSIS — R109 Unspecified abdominal pain: Secondary | ICD-10-CM | POA: Diagnosis not present

## 2020-12-21 DIAGNOSIS — G43909 Migraine, unspecified, not intractable, without status migrainosus: Secondary | ICD-10-CM | POA: Insufficient documentation

## 2020-12-21 DIAGNOSIS — E1142 Type 2 diabetes mellitus with diabetic polyneuropathy: Secondary | ICD-10-CM | POA: Insufficient documentation

## 2020-12-21 DIAGNOSIS — N393 Stress incontinence (female) (male): Secondary | ICD-10-CM | POA: Insufficient documentation

## 2020-12-21 DIAGNOSIS — F4310 Post-traumatic stress disorder, unspecified: Secondary | ICD-10-CM | POA: Insufficient documentation

## 2020-12-21 DIAGNOSIS — R911 Solitary pulmonary nodule: Secondary | ICD-10-CM | POA: Insufficient documentation

## 2020-12-21 DIAGNOSIS — I10 Essential (primary) hypertension: Secondary | ICD-10-CM | POA: Insufficient documentation

## 2020-12-21 DIAGNOSIS — G8929 Other chronic pain: Secondary | ICD-10-CM | POA: Insufficient documentation

## 2020-12-21 DIAGNOSIS — K760 Fatty (change of) liver, not elsewhere classified: Secondary | ICD-10-CM | POA: Diagnosis not present

## 2020-12-21 DIAGNOSIS — K6389 Other specified diseases of intestine: Secondary | ICD-10-CM

## 2020-12-21 DIAGNOSIS — K529 Noninfective gastroenteritis and colitis, unspecified: Secondary | ICD-10-CM | POA: Diagnosis not present

## 2020-12-21 DIAGNOSIS — M5090 Cervical disc disorder, unspecified, unspecified cervical region: Secondary | ICD-10-CM | POA: Insufficient documentation

## 2020-12-21 NOTE — Progress Notes (Signed)
  Subjective:  Patient ID: Stacy Guzman, female    DOB: [birth year],  MRN: [id number]  Chief Complaint  Patient presents with  . Foot Pain    PT stated she has a history of fibromyalgia and a lot of nerve pain. She stated that she is having a lot of pain with her right foot and ankle she stated she has some numbness and discoloration to the right ankle. She has a constant pain and is needing some relief.    50 y.o. female presents with the above complaint. History confirmed with patient.  She localizes this to the anteromedial ankle.  She says she broke this when she was a kid and her father set it.  She has multiple pain related issues including fibromyalgia, peripheral neuropathy and lumbar back pain.  Objective:  Physical Exam: warm, good capillary refill, no trophic changes or ulcerative lesions, normal DP and PT pulses and normal sensory exam.   Right Foot: She complains of pain in the anteromedial ankle but with distraction there is good range of motion without pain on palpation.  She does have some equinus.  Radiographs: X-ray of right ankle: no fracture, dislocation, swelling or degenerative changes noted Assessment:   1. Right ankle injury, sequela   2. Type 2 diabetes mellitus with diabetic polyneuropathy, without long-term current use of insulin (Stacy)   3. Fibromyalgia affecting multiple sites      Plan:  Patient was evaluated and treated and all questions answered.  I evaluated the patient and reviewed the exam findings with her as well as her radiographs taken today.  There are no acute osseous abnormalities.  There is no pathologic issue that I can identify within the foot and ankle itself.  I think most of the pain she is having is referred pain from her multiple neurogenic issues including fibromyalgia, lumbar spine issues as well as peripheral neuropathy.  Advised to try lidocaine patches OTC and perform gastrocnemius stretching exercises to alleviate pain.  No  indication for further imaging at this time  Return if symptoms worsen or fail to improve.

## 2020-12-24 DIAGNOSIS — E1149 Type 2 diabetes mellitus with other diabetic neurological complication: Secondary | ICD-10-CM | POA: Diagnosis not present

## 2020-12-24 DIAGNOSIS — G8929 Other chronic pain: Secondary | ICD-10-CM | POA: Diagnosis not present

## 2020-12-24 DIAGNOSIS — W57XXXD Bitten or stung by nonvenomous insect and other nonvenomous arthropods, subsequent encounter: Secondary | ICD-10-CM | POA: Diagnosis not present

## 2020-12-24 DIAGNOSIS — M5410 Radiculopathy, site unspecified: Secondary | ICD-10-CM | POA: Diagnosis not present

## 2020-12-24 DIAGNOSIS — M5136 Other intervertebral disc degeneration, lumbar region: Secondary | ICD-10-CM | POA: Diagnosis not present

## 2020-12-24 DIAGNOSIS — M797 Fibromyalgia: Secondary | ICD-10-CM | POA: Diagnosis not present

## 2020-12-24 DIAGNOSIS — F17200 Nicotine dependence, unspecified, uncomplicated: Secondary | ICD-10-CM | POA: Diagnosis not present

## 2020-12-25 ENCOUNTER — Other Ambulatory Visit: Payer: Self-pay | Admitting: Gastroenterology

## 2020-12-25 DIAGNOSIS — R1011 Right upper quadrant pain: Secondary | ICD-10-CM

## 2020-12-25 DIAGNOSIS — K828 Other specified diseases of gallbladder: Secondary | ICD-10-CM

## 2020-12-26 ENCOUNTER — Ambulatory Visit
Admission: RE | Admit: 2020-12-26 | Discharge: 2020-12-26 | Disposition: A | Discharge status: Home / Self Care | Payer: Medicare Other | Source: Ambulatory Visit | Attending: Gastroenterology | Admitting: Gastroenterology

## 2020-12-26 DIAGNOSIS — K7689 Other specified diseases of liver: Secondary | ICD-10-CM | POA: Diagnosis not present

## 2020-12-26 DIAGNOSIS — R1011 Right upper quadrant pain: Secondary | ICD-10-CM

## 2020-12-26 DIAGNOSIS — K828 Other specified diseases of gallbladder: Secondary | ICD-10-CM

## 2020-12-27 ENCOUNTER — Other Ambulatory Visit (HOSPITAL_COMMUNITY): Payer: Self-pay | Admitting: Gastroenterology

## 2020-12-27 DIAGNOSIS — R140 Abdominal distension (gaseous): Secondary | ICD-10-CM

## 2020-12-27 DIAGNOSIS — R1011 Right upper quadrant pain: Secondary | ICD-10-CM

## 2020-12-28 DIAGNOSIS — K921 Melena: Secondary | ICD-10-CM | POA: Diagnosis not present

## 2020-12-29 ENCOUNTER — Emergency Department (HOSPITAL_COMMUNITY)
Admission: EM | Admit: 2020-12-29 | Discharge: 2020-12-29 | Disposition: A | Discharge status: Home / Self Care | Payer: Medicare Other | Attending: Emergency Medicine | Admitting: Emergency Medicine

## 2020-12-29 ENCOUNTER — Encounter (HOSPITAL_COMMUNITY): Payer: Self-pay | Admitting: Emergency Medicine

## 2020-12-29 DIAGNOSIS — K921 Melena: Secondary | ICD-10-CM | POA: Insufficient documentation

## 2020-12-29 DIAGNOSIS — G8929 Other chronic pain: Secondary | ICD-10-CM | POA: Diagnosis not present

## 2020-12-29 DIAGNOSIS — R1084 Generalized abdominal pain: Secondary | ICD-10-CM | POA: Diagnosis not present

## 2020-12-29 DIAGNOSIS — Z5321 Procedure and treatment not carried out due to patient leaving prior to being seen by health care provider: Secondary | ICD-10-CM | POA: Insufficient documentation

## 2020-12-29 LAB — CBC
HCT: 44.8 % (ref 36.0–46.0)
Hemoglobin: 15.1 g/dL — ABNORMAL HIGH (ref 12.0–15.0)
MCH: 31.1 pg (ref 26.0–34.0)
MCHC: 33.7 g/dL (ref 30.0–36.0)
MCV: 92.2 fL (ref 80.0–100.0)
Platelets: 266 10*3/uL (ref 150–400)
RBC: 4.86 MIL/uL (ref 3.87–5.11)
RDW: 12.8 % (ref 11.5–15.5)
WBC: 11.2 10*3/uL — ABNORMAL HIGH (ref 4.0–10.5)
nRBC: 0 % (ref 0.0–0.2)

## 2020-12-29 LAB — TYPE AND SCREEN
ABO/RH(D): A POS
Antibody Screen: NEGATIVE

## 2020-12-29 LAB — COMPREHENSIVE METABOLIC PANEL
ALT: 25 U/L (ref 0–44)
AST: 17 U/L (ref 15–41)
Albumin: 3.9 g/dL (ref 3.5–5.0)
Alkaline Phosphatase: 65 U/L (ref 38–126)
Anion gap: 8 (ref 5–15)
BUN: 11 mg/dL (ref 6–20)
CO2: 26 mmol/L (ref 22–32)
Calcium: 9.3 mg/dL (ref 8.9–10.3)
Chloride: 104 mmol/L (ref 98–111)
Creatinine, Ser: 0.72 mg/dL (ref 0.44–1.00)
GFR, Estimated: >60 mL/min (ref >60)
Glucose, Bld: 118 mg/dL — ABNORMAL HIGH (ref 70–99)
Potassium: 4.3 mmol/L (ref 3.5–5.1)
Sodium: 138 mmol/L (ref 135–145)
Total Bilirubin: 0.6 mg/dL (ref 0.3–1.2)
Total Protein: 7 g/dL (ref 6.5–8.1)

## 2020-12-29 NOTE — ED Notes (Signed)
Pt stated she was leaving.  

## 2020-12-29 NOTE — ED Triage Notes (Addendum)
Pt reports black stools since yesterday.  States she was seen by gastroenterologist yesterday and had labs.  Reports generalized abd pain x 8 years.  States pain worse on R side.  States she is also being treated for an intestinal infection.

## 2021-01-03 ENCOUNTER — Ambulatory Visit (HOSPITAL_COMMUNITY): Payer: Medicare Other

## 2021-01-04 DIAGNOSIS — K921 Melena: Secondary | ICD-10-CM | POA: Diagnosis not present

## 2021-01-21 ENCOUNTER — Ambulatory Visit (HOSPITAL_COMMUNITY)
Admission: RE | Admit: 2021-01-21 | Discharge: 2021-01-21 | Disposition: A | Discharge status: Home / Self Care | Payer: Medicare Other | Source: Ambulatory Visit | Attending: Gastroenterology | Admitting: Gastroenterology

## 2021-01-21 ENCOUNTER — Encounter (HOSPITAL_COMMUNITY): Payer: Self-pay

## 2021-01-21 ENCOUNTER — Other Ambulatory Visit: Payer: Self-pay

## 2021-01-21 ENCOUNTER — Other Ambulatory Visit (HOSPITAL_COMMUNITY): Payer: Medicare Other

## 2021-01-21 DIAGNOSIS — G8929 Other chronic pain: Secondary | ICD-10-CM | POA: Diagnosis not present

## 2021-01-21 DIAGNOSIS — R140 Abdominal distension (gaseous): Secondary | ICD-10-CM | POA: Insufficient documentation

## 2021-01-21 DIAGNOSIS — R1011 Right upper quadrant pain: Secondary | ICD-10-CM | POA: Diagnosis not present

## 2021-01-21 MED ORDER — TECHNETIUM TC 99M MEBROFENIN IV KIT
5.0000 | PACK | Freq: Once | INTRAVENOUS | Status: AC | PRN
  Administered 2021-01-21: 5 via INTRAVENOUS

## 2021-01-22 ENCOUNTER — Ambulatory Visit (HOSPITAL_COMMUNITY): Payer: Medicare Other

## 2021-01-22 ENCOUNTER — Encounter (HOSPITAL_COMMUNITY): Payer: Self-pay

## 2021-01-30 DIAGNOSIS — K5792 Diverticulitis of intestine, part unspecified, without perforation or abscess without bleeding: Secondary | ICD-10-CM | POA: Diagnosis not present

## 2021-01-30 DIAGNOSIS — E119 Type 2 diabetes mellitus without complications: Secondary | ICD-10-CM | POA: Diagnosis not present

## 2021-01-30 DIAGNOSIS — K760 Fatty (change of) liver, not elsewhere classified: Secondary | ICD-10-CM | POA: Diagnosis not present

## 2021-01-30 DIAGNOSIS — Z008 Encounter for other general examination: Secondary | ICD-10-CM | POA: Diagnosis not present

## 2021-02-22 DIAGNOSIS — J069 Acute upper respiratory infection, unspecified: Secondary | ICD-10-CM | POA: Diagnosis not present

## 2021-03-06 ENCOUNTER — Other Ambulatory Visit: Payer: Self-pay

## 2021-03-06 ENCOUNTER — Ambulatory Visit (INDEPENDENT_AMBULATORY_CARE_PROVIDER_SITE_OTHER): Payer: Medicare Other | Admitting: Allergy

## 2021-03-06 ENCOUNTER — Encounter: Payer: Self-pay | Admitting: Allergy

## 2021-03-06 VITALS — BP 128/68 | HR 71 | Temp 98.0°F | Resp 20 | Ht 63.5 in | Wt 186.8 lb

## 2021-03-06 DIAGNOSIS — J3089 Other allergic rhinitis: Secondary | ICD-10-CM | POA: Diagnosis not present

## 2021-03-06 DIAGNOSIS — L253 Unspecified contact dermatitis due to other chemical products: Secondary | ICD-10-CM | POA: Diagnosis not present

## 2021-03-06 DIAGNOSIS — L2089 Other atopic dermatitis: Secondary | ICD-10-CM

## 2021-03-06 DIAGNOSIS — K9049 Malabsorption due to intolerance, not elsewhere classified: Secondary | ICD-10-CM

## 2021-03-06 DIAGNOSIS — T781XXD Other adverse food reactions, not elsewhere classified, subsequent encounter: Secondary | ICD-10-CM

## 2021-03-06 MED ORDER — EPINEPHRINE 0.3 MG/0.3ML IJ SOAJ: 0.3000 mg | INTRAMUSCULAR | 2 refills | Status: AC | PRN

## 2021-03-06 NOTE — Progress Notes (Signed)
New Patient Note  RE: Stacy Guzman Stacy Guzman: August 31, 1971 Date of Office Visit: 03/06/2021  Primary care provider: Kelton Pillar, MD  Chief Complaint: food allergy  History of present illness: Stacy Guzman is a 50 y.o. female presenting today for evaluation of abdominal pain/food allergy.   She states she is allergic to a lot of different things.   She states she has been sick for past 8 years and lost 30lbs in a short time frame.  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 She had a hysterectomy in hopes this would help with her abdominal pain. This has not helped.   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  She reports she has severe bloats/swells, burps constantly, severe abdominal pain and cramping.  She takes pepto to help with these symptoms.  She has tried changing her diet which has not helped much.  She states allergy testing is the last stop to see if there is a reason for her GI symptoms.   She states chocolates causes a rash. She states dairy and some breads causes severe bloating.  Percolated coffee makes her stomach pain worse.  Tomatoes also makes abdominal symptoms worse.    Most vegetables except potatoes cause her extreme gas.  She states drinking a beer or two a day helps with decreasing the gas.    She also reports developing raw, sore bumps on her scalp.  She states these bumps can ooze a clear liquid.  She states she is having hard to finding hair products she can use that do not clear her scalp.    She reports having stress eczema and can occur on her hands and soles of feet.  She states can't use any topical steroids as it increases her blood sugar and changes her mood.   She has dry eye disease.   She has sneezing with mowed grass exposure.  She also has nasal drainage, ear clogged (she may have tube placement).  She will take zyrtec when symptoms are very bad; helps somewhat.  She uses saline spray to  help moisturize the nose.    Review of systems: Review of Systems  Constitutional: Positive for weight loss.  HENT:       See HPI  Eyes:       See HPI  Respiratory: Negative.   Cardiovascular: Negative.   Gastrointestinal:       See HPI  Musculoskeletal: Negative.   Skin: Positive for itching and rash.  Neurological: Negative.     All other systems negative unless noted above in HPI  Past medical history: Past Medical History:  Diagnosis Date  . Arthritis   . Bipolar 1 disorder ([place])   . Colon polyp   . Diabetes mellitus without complication ([place])   . Fibromyalgia   . GERD (gastroesophageal reflux disease)   . Hypercholesteremia   . Hypertension   . IBS (irritable bowel syndrome)   . Kidney stone   . Lung nodule   . Melanoma ([place])   . Migraine   . Pneumonia   . PTSD (post-traumatic stress disorder)   . Sleep apnea     Past surgical history: Past Surgical History:  Procedure Laterality Date  . DILATION AND CURETTAGE OF UTERUS    . KNEE ARTHROSCOPY    . THROAT SURGERY    . TUBAL LIGATION  12/03/2015  . uterine ablation      Family history:  Family History  Problem  Relation Age of Onset  . Breast cancer Mother     Social history: Lives in a apartment without carpeting with electric heating and central cooling.  No pets in the home.  No concern for roaches in the home.  There is concern for water damage, mildew in home.  She is disabled.   Tobacco Use  . Smoking status: Current Some Day Smoker    Packs/day: 2.00    Years: 36.00    Pack years: 72.00    Types: Cigarettes  . Smokeless tobacco: Never Used     Medication List: Current Outpatient Medications  Medication Sig Dispense Refill  . clindamycin (CLEOCIN T) 1 % external solution APPLY TOPICALLY 2 (TWO) TIMES DAILY AS DIRECTED    . cyclobenzaprine (FLEXERIL) 10 MG tablet Take 10 mg by mouth 3 (three) times daily as needed for muscle spasms.    . diphenhydrAMINE (BENADRYL) 25 MG tablet Take 25 mg  by mouth as needed for allergies.     . Famotidine (PEPCID PO) Take 10 mg by mouth.    . hydrochlorothiazide (MICROZIDE) 12.5 MG capsule Take 12.5 mg by mouth daily.    . metoprolol succinate (TOPROL-XL) 100 MG 24 hr tablet Take by mouth.    Marland Kitchen omeprazole (PRILOSEC) 20 MG capsule TAKE 2 CAPSULES (40 MG TOTAL) BY MOUTH DAILY FOR 30 DAYS.    Marland Kitchen sucralfate (CARAFATE) 1000 MG/10ML SUSP Take by mouth every 6 (six) hours.     No current facility-administered medications for this visit.    Known medication allergies: Allergies  Allergen Reactions  . Iohexol Anaphylaxis    Pt states she received dye at [facility] and developed airway/ tongue swelling   . Other Anaphylaxis    IV contrast  . Acetaminophen Other (See Comments)    headaches  . Celebrex [Celecoxib]     Mood changes  . Chantix [Varenicline] Other (See Comments)    Mood changes  . Crestor [Rosuvastatin Calcium]     Muscle pain/hives  . Ibuprofen     Migraine  . Keflex [Cephalexin]     Nausea/yeast infection  . Lamictal [Lamotrigine]     unknown  . Lyrica [Pregabalin]   . Meloxicam     Stomach problems  . Metoclopramide Other (See Comments)    Mood changes  . Naproxen     Stomach pain  . Nsaids   . Prednisone Other (See Comments)    Causes sugar lvl to rise  . Risperidone And Related Swelling  . Voltaren [Diclofenac Sodium]      Physical examination: Blood pressure 128/68, pulse 71, temperature 98 F (36.7 C), temperature source Temporal, resp. rate 20, height 5' 3.5" (1.613 m), weight 186 lb 12.8 oz (84.7 kg), SpO2 100 %.  General: Alert, interactive, in no acute distress. HEENT: PERRLA, TMs pearly gray, turbinates minimally edematous without discharge, post-pharynx non erythematous. Neck: Supple without lymphadenopathy. Lungs: Clear to auscultation without wheezing, rhonchi or rales. {no increased work of breathing. CV: Normal S1, S2 without murmurs. Abdomen: Nondistended,  nontender. Skin: Erythematous papules over facial cheeks and medial upper arms b/l Extremities:  No clubbing, cyanosis or edema. Neuro:   Grossly intact.  Diagnositics/Labs:  Allergy testing: environmental allergy skin prick testing is  positive to sweet vernal, marsh elder, mugwort, penicillium.   Select food allergy skin prick testing is positive to tomato.  Allergy testing results were read and interpreted by provider, documented by clinical staff.   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  You have IgE to tomato and thus would avoid tomato products in the diet - have access to self-injectable epinephrine (Epipen or AuviQ) 0.3mg  at all times - follow emergency action plan in case of allergic reaction  - we have discussed the following in regards to foods:   Allergy: food allergy is when you have eaten a food, developed an allergic reaction after eating the food and have IgE to the food (positive food testing either by skin testing or blood testing).  Food allergy could lead to life threatening symptoms  Sensitivity: occurs when you have IgE to a food (positive food testing either by skin testing or blood testing) but is a food you eat without any issues.  This is not an allergy and we recommend keeping the food in the diet  Intolerance: this is when you have negative testing by either skin testing or blood testing thus not allergic but the food causes symptoms (like belly pain, bloating, diarrhea etc) with ingestion.  These foods should be avoided to prevent symptoms.    Environmental allergy - environmental allergy testing is positive to grass pollen, weed pollen and mold - allergen avoidance measures discussed/handouts provided - use Zyrtec as needed for general allergy symptom control  Contact dermatitis and Eczema - this is best tested by patch testing.  Patch testing is best placed on a Monday with return  to office for readings on Wednesday and Friday of same week.  You do not have to stop antihistamine for patch testing.   If you have body products you would like to test for you can bring your products in as well for patch testing - continue your avoidance measures of items that cause skin rash - continue your skin care routine  Follow-up in 6-12 months for routine visit or sooner for patch testing I appreciate the opportunity to take part in [facility] care. Please do not hesitate to contact me with questions.  Sincerely,   Prudy Feeler, MD Allergy/Immunology [facility]

## 2021-03-06 NOTE — Patient Instructions (Addendum)
Adverse food reaction - food allergy testing is positive to tomato only.  You have IgE to tomato and thus would avoid tomato products in the diet - have access to self-injectable epinephrine (Epipen or AuviQ) 0.3mg  at all times - follow emergency action plan in case of allergic reaction  - we have discussed the following in regards to foods:   Allergy: food allergy is when you have eaten a food, developed an allergic reaction after eating the food and have IgE to the food (positive food testing either by skin testing or blood testing).  Food allergy could lead to life threatening symptoms  Sensitivity: occurs when you have IgE to a food (positive food testing either by skin testing or blood testing) but is a food you eat without any issues.  This is not an allergy and we recommend keeping the food in the diet  Intolerance: this is when you have negative testing by either skin testing or blood testing thus not allergic but the food causes symptoms (like belly pain, bloating, diarrhea etc) with ingestion.  These foods should be avoided to prevent symptoms.    Environmental allergy - environmental allergy testing is positive to grass pollen, weed pollen and mold - allergen avoidance measures discussed/handouts provided - use Zyrtec as needed for general allergy symptom control  Contact dermatitis and Eczema - this is best tested by patch testing.  Patch testing is best placed on a Monday with return to office for readings on Wednesday and Friday of same week.  You do not have to stop antihistamine for patch testing.   If you have body products you would like to test for you can bring your products in as well for patch testing - continue your avoidance measures of items that cause skin rash - continue your skin care routine  True Test looks for the following sensitivities:      Follow-up in 6-12 months for routine visit or sooner for patch testing

## 2021-03-15 DIAGNOSIS — E1149 Type 2 diabetes mellitus with other diabetic neurological complication: Secondary | ICD-10-CM | POA: Diagnosis not present

## 2021-03-15 DIAGNOSIS — L03811 Cellulitis of head [any part, except face]: Secondary | ICD-10-CM | POA: Diagnosis not present

## 2021-03-15 DIAGNOSIS — K219 Gastro-esophageal reflux disease without esophagitis: Secondary | ICD-10-CM | POA: Diagnosis not present

## 2021-04-16 ENCOUNTER — Encounter: Payer: Self-pay | Admitting: Podiatry

## 2021-04-16 ENCOUNTER — Other Ambulatory Visit: Payer: Self-pay

## 2021-04-16 ENCOUNTER — Ambulatory Visit (INDEPENDENT_AMBULATORY_CARE_PROVIDER_SITE_OTHER): Payer: Medicare Other | Admitting: Podiatry

## 2021-04-16 DIAGNOSIS — M722 Plantar fascial fibromatosis: Secondary | ICD-10-CM | POA: Diagnosis not present

## 2021-04-16 DIAGNOSIS — E1142 Type 2 diabetes mellitus with diabetic polyneuropathy: Secondary | ICD-10-CM

## 2021-04-16 DIAGNOSIS — M205X1 Other deformities of toe(s) (acquired), right foot: Secondary | ICD-10-CM | POA: Diagnosis not present

## 2021-04-16 NOTE — Progress Notes (Signed)
This patient presents to the office for continued evaluation of her right foot.  She says she is having difficulty wearing her diabetic shoes.  She says she is experiencing pain on the inside of her right ankle and inside of right knee.  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  She presents for evaluation and treatment of her feet.  Vascular  Dorsalis pedis and posterior tibial pulses are palpable  B/L.  Capillary return  WNL.  Temperature gradient is  WNL.  Skin turgor  WNL  Sensorium  Senn Weinstein monofilament wire  WNL. Normal tactile sensation.  Nail Exam  Patient has normal nails with no evidence of bacterial or fungal infection.  Orthopedic  Exam  Muscle tone and muscle strength  WNL.  No limitations of motion feet  B/L.  No crepitus or joint effusion noted.  Foot type is unremarkable and digits show no abnormalities.  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  Upon weight bearing she has significant flattening of foot profile.    Skin  No open lesions.  Normal skin texture and turgor.   Pes planus  Hallux limitus 1st MPJ  right foot.  ROV.  Added padding to the right orthoses.  Told her to make an appointment with [facility] for orthotic casting.  RTC prn   Gardiner Barefoot DPM

## 2021-05-17 ENCOUNTER — Other Ambulatory Visit: Payer: Medicare Other

## 2021-05-17 ENCOUNTER — Other Ambulatory Visit: Payer: Self-pay

## 2021-07-22 ENCOUNTER — Other Ambulatory Visit: Payer: Self-pay | Admitting: Pain Medicine

## 2021-07-22 DIAGNOSIS — Z1231 Encounter for screening mammogram for malignant neoplasm of breast: Secondary | ICD-10-CM

## 2021-10-02 ENCOUNTER — Other Ambulatory Visit: Payer: Self-pay

## 2021-10-02 ENCOUNTER — Ambulatory Visit (INDEPENDENT_AMBULATORY_CARE_PROVIDER_SITE_OTHER): Payer: Medicare Other

## 2021-10-02 DIAGNOSIS — Z1231 Encounter for screening mammogram for malignant neoplasm of breast: Secondary | ICD-10-CM | POA: Diagnosis not present

## 2021-10-03 ENCOUNTER — Other Ambulatory Visit: Payer: Self-pay | Admitting: Pain Medicine

## 2021-10-03 DIAGNOSIS — R928 Other abnormal and inconclusive findings on diagnostic imaging of breast: Secondary | ICD-10-CM

## 2021-11-11 ENCOUNTER — Ambulatory Visit
Admission: RE | Admit: 2021-11-11 | Discharge: 2021-11-11 | Disposition: A | Discharge status: Home / Self Care | Payer: Medicare Other | Source: Ambulatory Visit | Attending: Pain Medicine | Admitting: Pain Medicine

## 2021-11-11 ENCOUNTER — Other Ambulatory Visit: Payer: Self-pay | Admitting: Pain Medicine

## 2021-11-11 DIAGNOSIS — R928 Other abnormal and inconclusive findings on diagnostic imaging of breast: Secondary | ICD-10-CM

## 2021-11-11 DIAGNOSIS — R921 Mammographic calcification found on diagnostic imaging of breast: Secondary | ICD-10-CM
